# Patient Record
Sex: Male | Born: 1955
Health system: Southern US, Community
[De-identification: ages and names within clinical notes are randomized; demographics above are authoritative.]

## PROBLEM LIST (undated history)

## (undated) DIAGNOSIS — I10 Essential (primary) hypertension: Secondary | ICD-10-CM

## (undated) DIAGNOSIS — E663 Overweight: Secondary | ICD-10-CM

## (undated) DIAGNOSIS — N189 Chronic kidney disease, unspecified: Secondary | ICD-10-CM

## (undated) DIAGNOSIS — I429 Cardiomyopathy, unspecified: Secondary | ICD-10-CM

## (undated) DIAGNOSIS — N183 Chronic kidney disease, stage 3 (moderate): Secondary | ICD-10-CM

## (undated) DIAGNOSIS — I5043 Acute on chronic combined systolic (congestive) and diastolic (congestive) heart failure: Secondary | ICD-10-CM

## (undated) DIAGNOSIS — R0602 Shortness of breath: Secondary | ICD-10-CM

## (undated) DIAGNOSIS — N529 Male erectile dysfunction, unspecified: Secondary | ICD-10-CM

## (undated) DIAGNOSIS — M10379 Gout due to renal impairment, unspecified ankle and foot: Secondary | ICD-10-CM

## (undated) DIAGNOSIS — N179 Acute kidney failure, unspecified: Secondary | ICD-10-CM

## (undated) DIAGNOSIS — I5022 Chronic systolic (congestive) heart failure: Secondary | ICD-10-CM

## (undated) DIAGNOSIS — E785 Hyperlipidemia, unspecified: Secondary | ICD-10-CM

## (undated) HISTORY — DX: Cardiomyopathy, unspecified: I42.9

## (undated) HISTORY — PX: NO PAST SURGERIES: SHX2092

## (undated) HISTORY — DX: Essential (primary) hypertension: I10

## (undated) HISTORY — DX: Chronic systolic (congestive) heart failure: I50.22

## (undated) HISTORY — DX: Gout due to renal impairment, unspecified ankle and foot: M10.379

## (undated) HISTORY — DX: Hyperlipidemia, unspecified: E78.5

## (undated) HISTORY — DX: Shortness of breath: R06.02

## (undated) HISTORY — DX: Acute kidney failure, unspecified: N17.9

## (undated) HISTORY — DX: Chronic kidney disease, unspecified: N18.9

## (undated) HISTORY — DX: Male erectile dysfunction, unspecified: N52.9

## (undated) HISTORY — DX: Overweight: E66.3

## (undated) HISTORY — DX: Acute on chronic combined systolic (congestive) and diastolic (congestive) heart failure: I50.43

## (undated) HISTORY — DX: Chronic kidney disease, stage 3 (moderate): N18.3

---

## 2002-09-03 ENCOUNTER — Emergency Department (HOSPITAL_COMMUNITY): Admission: AD | Admit: 2002-09-03 | Discharge: 2002-09-04 | Payer: Self-pay | Admitting: Emergency Medicine

## 2002-12-15 ENCOUNTER — Inpatient Hospital Stay (HOSPITAL_COMMUNITY): Admission: EM | Admit: 2002-12-15 | Discharge: 2002-12-21 | Payer: Self-pay | Admitting: Emergency Medicine

## 2002-12-16 ENCOUNTER — Encounter (INDEPENDENT_AMBULATORY_CARE_PROVIDER_SITE_OTHER): Payer: Self-pay | Admitting: *Deleted

## 2002-12-27 ENCOUNTER — Encounter: Admission: RE | Admit: 2002-12-27 | Discharge: 2002-12-27 | Payer: Self-pay | Admitting: Internal Medicine

## 2004-09-23 IMAGING — NM NM MYOCAR MULTI W/ SPECT
7 series · 42 of 42 positions shown · non-contrast
Comparison: none

CLINICAL DATA: Hypertension, CHF and shortness of breath.  
 NUCLEAR MEDICINE MYOCARDIAL PERFUSION EJECTION FRACTION CALCULATION, WALL MOTION STUDY, AND MULTIPLE SPECT EXAMINATION WITH REST AND PERSANTINE STRESS   - 12/20/02
 No prior studies for comparison. 
 RADIOPHARMACEUTICALS:  10 mCi 00mtc Sestamibi IV at rest and 30 mCi 00mtc Sestamibi IV with Persantine stress.  
 EJECTION FRACTION CALCULATION:  Utilizing gated data, the end-diastolic volume is estimated to be 297 cc and the end-systolic volume 203 cc.  The calculated ejection fraction is 32 percent. 
 QGSLVEF of 32 percent. 
 WALL MOTION ANALYSIS
 The gated study shows diffuse left ventricular dysfunction.  Wall motion abnormalities are most significant at the level of the apex and inferior wall although all walls are felt to be hypokinetic.  
 IMPRESSION
 Global left ventricular dysfunction with no significant wall motion abnormalities involving inferior wall and apex. 
 MULTIPLE SPECT IMAGING WITH REST AN D PERSANTINE STRESS
 There is attenuation of the inferior wall on the study which is slightly more prominent on the rest examination.   It is felt that this is at least partially secondary to diaphragmatic attenuation although a component of scar especially towards the base of the inferior wall cannot be excluded.   Left ventricular cavity is dilated.   No evidence to suggest inducible ischemia after administration of Persantine.   
 Inferior wall attenuation felt at least partially due to diaphragmatic attenuation.   Component of scar cannot be excluded, especially involving the base of the inferior wall.   No evidence of ischemia.

[Series 1: rc rest cardiolite · 6.8mm · 6.85mm/px · 6 of 21 frames shown (1 of 7)]
[frame 2/21]
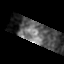
[frame 6/21]
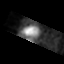
[frame 9/21]
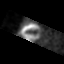
[frame 13/21]
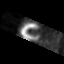
[frame 16/21]
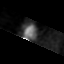
[frame 20/21]
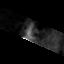

[Series 1: rc rest cardiolite · 6.8mm · 6.85mm/px · 6 of 21 frames shown (2 of 7)]
[frame 2/21]
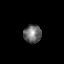
[frame 6/21]
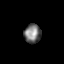
[frame 9/21]
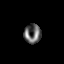
[frame 13/21]
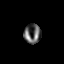
[frame 16/21]
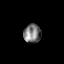
[frame 20/21]
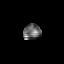

[Series 1: rc rest cardiolite · 6.8mm · 6.85mm/px · 6 of 27 frames shown (3 of 7)]
[frame 3/27]
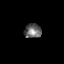
[frame 7/27]
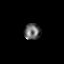
[frame 12/27]
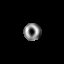
[frame 16/27]
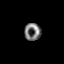
[frame 21/27]
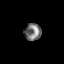
[frame 25/27]
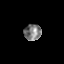

[Series 1: rc rest cardiolite · 6.85mm/px · 6 of 64 frames shown (4 of 7)]
[frame 6/64]
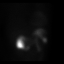
[frame 16/64]
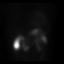
[frame 27/64]
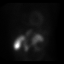
[frame 38/64]
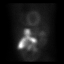
[frame 48/64]
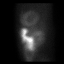
[frame 59/64]
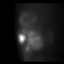

[Series 1: rc rest cardiolite · 6.8mm · 6.85mm/px · 6 of 21 frames shown (5 of 7)]
[frame 2/21]
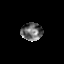
[frame 6/21]
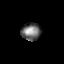
[frame 9/21]
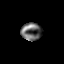
[frame 13/21]
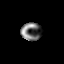
[frame 16/21]
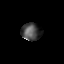
[frame 20/21]
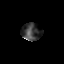

[Series 1: rc rest cardiolite · 6.8mm · 6.85mm/px · 6 of 21 frames shown (6 of 7)]
[frame 2/21]
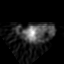
[frame 6/21]
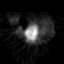
[frame 9/21]
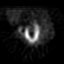
[frame 13/21]
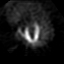
[frame 16/21]
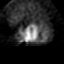
[frame 20/21]
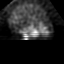

[Series 1: rc rest cardiolite · 6.8mm · 6.85mm/px · 6 of 27 frames shown (7 of 7)]
[frame 3/27]
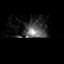
[frame 7/27]
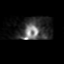
[frame 12/27]
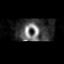
[frame 16/27]
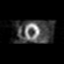
[frame 21/27]
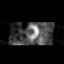
[frame 25/27]
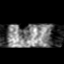

[42 of 42 positions shown; findings below may reference images not displayed]

## 2010-01-18 ENCOUNTER — Inpatient Hospital Stay (HOSPITAL_COMMUNITY)
Admission: EM | Admit: 2010-01-18 | Discharge: 2010-01-21 | Payer: Self-pay | Source: Home / Self Care | Attending: Internal Medicine | Admitting: Internal Medicine

## 2010-01-19 ENCOUNTER — Encounter (INDEPENDENT_AMBULATORY_CARE_PROVIDER_SITE_OTHER): Payer: Self-pay | Admitting: Internal Medicine

## 2010-02-01 ENCOUNTER — Emergency Department (HOSPITAL_COMMUNITY)
Admission: EM | Admit: 2010-02-01 | Discharge: 2010-02-01 | Payer: Self-pay | Source: Home / Self Care | Admitting: Family Medicine

## 2010-02-17 ENCOUNTER — Encounter: Payer: Self-pay | Admitting: Internal Medicine

## 2010-02-23 NOTE — H&P (Signed)
NAME:  Jeffery Hodges, Jeffery Hodges NO.:  [id number]  MEDICAL RECORD NO.:  [id number]          PATIENT TYPE:  INP  LOCATION:  1234                         FACILITY:  [facility]  PHYSICIAN:  Massie Maroon, MD        DATE OF BIRTH:  [birth year]  DATE OF ADMISSION:  01/18/2010 DATE OF DISCHARGE:                             HISTORY & PHYSICAL   CHIEF COMPLAINT:  "I was short of breath."  HISTORY OF PRESENT ILLNESS:  This 55 year old male with a history of cardiomyopathy and hypertension apparently presents with complaints of shortness of breath.  Apparently, it all started several weeks ago when he had a cold spell and he felt congested and then began having mucus and then began having increasing shortness of breath and wheezing.  This was worsening and so the patient presented today for evaluation and was found to be hypertensive in the ED to 187/142 initially.  Chest x-ray showed the possibility of mild CHF and BNP was elevated at 584.  Cardiac markers were negative and D-dimer was positive, but V/Q scan showed low probability V/Q.  The patient denies any chest pain, palpitations, weight gain, orthopnea, or PND.  The patient will be admitted for workup of possible mild CHF as well as hypertensive emergency.  PAST MEDICAL HISTORY: 1. Hypertension. 2. Cardiomyopathy (ejection fraction 15% to 25% on December 16, 2002,     cardiac 2-D echo) and mild aortic valvular regurgitation,     concentric LVH, estimated peak pulmonary artery systolic blood     pressure of 50.  PAST SURGICAL HISTORY:  None.  SOCIAL HISTORY:  The patient was born in [place].  He grew up in [place].  He does not smoke or drink at the present time.  He quit smoking in 2004 and smoked 1 pack per day for 28 years.  He presently works at [employer] at [employer].  FAMILY HISTORY:  Mother died at age 26 of lupus.  Father died of prostate cancer at age 24 and was a smoker.  ALLERGIES:  No known  drug allergies.  MEDICATIONS:  None.  REVIEW OF SYSTEMS:  Negative for all 10-organ systems except for pertinent positives stated above.  PHYSICAL EXAMINATION:  VITAL SIGNS:  Temperature 97.6, pulse 102, blood pressure 187/142, pulse ox is 95% on room air. HEENT:  Anicteric. NECK:  No JVD. HEART:  Regular rate and rhythm.  S1, S2.  No murmurs, gallops or rubs. LUNGS:  Clear to auscultation bilaterally. ABDOMEN:  Soft, nontender, nondistended.  Positive bowel sounds. EXTREMITIES:  No cyanosis, clubbing or edema. SKIN:  No rashes. LYMPH NODES:  No adenopathy. NEUROLOGIC:  Exam nonfocal.  LABORATORY DATA:  D-dimer 2.46 (positive).  WBC 5.7, hemoglobin 14.9, platelet count is 242.  Troponin-I less than 0.05.  Sodium 138, potassium 3.6, BUN 32, creatinine 2.30, AST 23, ALT 30, BNP 584.  X-RAY:  V/Q scan, low probability.  Chest x-ray:  Marked cardiomegaly with possible mild interstitial edema.  Nuclear stress test, December 20, 2002, negative for inducible ischemia.  EKG shows normal sinus rhythm at 100, normal axis, poor R-wave  progression, T-wave inversion in V2 through V6.  ASSESSMENT AND PLAN: 1. Congestive heart failure (mild):  Strict inputs and outputs.  Check     daily weights.  Lasix 40 mg IV b.i.d.  Consider spironolactone,     afterload reduction with lisinopril, nitroglycerine paste and     hydralazine for additional afterload reduction. 2. Hypertensive emergency:  Hydralazine IV q.6 h. p.r.n. systolic     blood pressure greater than 160, start on nitroglycerine paste, one-     half inch topically q.8 h.  Continue lisinopril at 5 mg p.o. daily     for now. 3. Chronic kidney disease (stage 3):  The patient will continue on his     regular job, we will consider stopping lisinopril and letting his     kidneys resolve. 4. Chronic kidney disease:  Check renal ultrasound. 5. Deep venous thrombosis prophylaxis:  Sequential compression     devices.     Massie Maroon,  MD     JYK/MEDQ  D:  01/19/2010  T:  01/19/2010  Job:  [id number]  Electronically Signed by Pearson Grippe MD on 02/23/2010 08:30:01 PM

## 2010-04-09 LAB — CBC
HCT: 39.8 % (ref 39.0–52.0)
HCT: 42.5 % (ref 39.0–52.0)
HCT: 43 % (ref 39.0–52.0)
HCT: 44.4 % (ref 39.0–52.0)
Hemoglobin: 14.1 g/dL (ref 13.0–17.0)
Hemoglobin: 14.9 g/dL (ref 13.0–17.0)
Hemoglobin: 14.9 g/dL (ref 13.0–17.0)
Hemoglobin: 15.3 g/dL (ref 13.0–17.0)
MCH: 31.6 pg (ref 26.0–34.0)
MCH: 32 pg (ref 26.0–34.0)
MCH: 32 pg (ref 26.0–34.0)
MCHC: 34.7 g/dL (ref 30.0–36.0)
MCHC: 35.1 g/dL (ref 30.0–36.0)
MCHC: 35.4 g/dL (ref 30.0–36.0)
MCV: 90.5 fL (ref 78.0–100.0)
MCV: 91.2 fL (ref 78.0–100.0)
MCV: 91.3 fL (ref 78.0–100.0)
Platelets: 206 10*3/uL (ref 150–400)
Platelets: 230 10*3/uL (ref 150–400)
Platelets: 242 10*3/uL (ref 150–400)
RBC: 4.4 MIL/uL (ref 4.22–5.81)
RBC: 4.66 MIL/uL (ref 4.22–5.81)
RBC: 4.71 MIL/uL (ref 4.22–5.81)
RBC: 4.83 MIL/uL (ref 4.22–5.81)
RDW: 13.3 % (ref 11.5–15.5)
RDW: 13.4 % (ref 11.5–15.5)
RDW: 13.4 % (ref 11.5–15.5)
WBC: 4.5 10*3/uL (ref 4.0–10.5)
WBC: 5.7 10*3/uL (ref 4.0–10.5)
WBC: 6 10*3/uL (ref 4.0–10.5)

## 2010-04-09 LAB — BRAIN NATRIURETIC PEPTIDE
Pro B Natriuretic peptide (BNP): 579 pg/mL — ABNORMAL HIGH (ref 0.0–100.0)
Pro B Natriuretic peptide (BNP): 584 pg/mL — ABNORMAL HIGH (ref 0.0–100.0)

## 2010-04-09 LAB — COMPREHENSIVE METABOLIC PANEL
ALT: 27 U/L (ref 0–53)
ALT: 30 U/L (ref 0–53)
AST: 19 U/L (ref 0–37)
AST: 23 U/L (ref 0–37)
Albumin: 2.8 g/dL — ABNORMAL LOW (ref 3.5–5.2)
Albumin: 2.9 g/dL — ABNORMAL LOW (ref 3.5–5.2)
Alkaline Phosphatase: 45 U/L (ref 39–117)
Alkaline Phosphatase: 56 U/L (ref 39–117)
BUN: 28 mg/dL — ABNORMAL HIGH (ref 6–23)
BUN: 32 mg/dL — ABNORMAL HIGH (ref 6–23)
CO2: 26 mEq/L (ref 19–32)
CO2: 27 mEq/L (ref 19–32)
Calcium: 8.3 mg/dL — ABNORMAL LOW (ref 8.4–10.5)
Calcium: 9 mg/dL (ref 8.4–10.5)
Chloride: 105 mEq/L (ref 96–112)
Chloride: 106 mEq/L (ref 96–112)
Creatinine, Ser: 1.79 mg/dL — ABNORMAL HIGH (ref 0.4–1.5)
Creatinine, Ser: 2.3 mg/dL — ABNORMAL HIGH (ref 0.4–1.5)
GFR calc Af Amer: 36 mL/min — ABNORMAL LOW (ref >60)
GFR calc Af Amer: 48 mL/min — ABNORMAL LOW (ref >60)
GFR calc non Af Amer: 30 mL/min — ABNORMAL LOW (ref >60)
GFR calc non Af Amer: 40 mL/min — ABNORMAL LOW (ref >60)
Glucose, Bld: 102 mg/dL — ABNORMAL HIGH (ref 70–99)
Glucose, Bld: 124 mg/dL — ABNORMAL HIGH (ref 70–99)
Potassium: 3.2 mEq/L — ABNORMAL LOW (ref 3.5–5.1)
Potassium: 3.6 mEq/L (ref 3.5–5.1)
Sodium: 138 mEq/L (ref 135–145)
Sodium: 140 mEq/L (ref 135–145)
Total Bilirubin: 0.8 mg/dL (ref 0.3–1.2)
Total Bilirubin: 0.9 mg/dL (ref 0.3–1.2)
Total Protein: 5.4 g/dL — ABNORMAL LOW (ref 6.0–8.3)
Total Protein: 5.9 g/dL — ABNORMAL LOW (ref 6.0–8.3)

## 2010-04-09 LAB — BASIC METABOLIC PANEL
BUN: 24 mg/dL — ABNORMAL HIGH (ref 6–23)
CO2: 30 mEq/L (ref 19–32)
Calcium: 8.2 mg/dL — ABNORMAL LOW (ref 8.4–10.5)
Calcium: 8.8 mg/dL (ref 8.4–10.5)
Chloride: 102 mEq/L (ref 96–112)
Creatinine, Ser: 1.88 mg/dL — ABNORMAL HIGH (ref 0.4–1.5)
GFR calc Af Amer: 40 mL/min — ABNORMAL LOW (ref >60)
GFR calc Af Amer: 45 mL/min — ABNORMAL LOW (ref >60)
GFR calc non Af Amer: 33 mL/min — ABNORMAL LOW (ref >60)
GFR calc non Af Amer: 37 mL/min — ABNORMAL LOW (ref >60)
Glucose, Bld: 102 mg/dL — ABNORMAL HIGH (ref 70–99)
Potassium: 3.2 mEq/L — ABNORMAL LOW (ref 3.5–5.1)
Potassium: 3.7 mEq/L (ref 3.5–5.1)
Sodium: 138 mEq/L (ref 135–145)
Sodium: 141 mEq/L (ref 135–145)

## 2010-04-09 LAB — DIFFERENTIAL
Basophils Absolute: 0.1 10*3/uL (ref 0.0–0.1)
Basophils Relative: 1 % (ref 0–1)
Eosinophils Absolute: 0.1 10*3/uL (ref 0.0–0.7)
Eosinophils Relative: 2 % (ref 0–5)
Lymphocytes Relative: 36 % (ref 12–46)
Lymphs Abs: 2.1 10*3/uL (ref 0.7–4.0)
Monocytes Absolute: 0.7 10*3/uL (ref 0.1–1.0)
Monocytes Relative: 12 % (ref 3–12)
Neutro Abs: 2.8 10*3/uL (ref 1.7–7.7)
Neutrophils Relative %: 49 % (ref 43–77)

## 2010-04-09 LAB — CARDIAC PANEL(CRET KIN+CKTOT+MB+TROPI)
CK, MB: 2.5 ng/mL (ref 0.3–4.0)
CK, MB: 2.9 ng/mL (ref 0.3–4.0)
Relative Index: 1.2 (ref 0.0–2.5)
Relative Index: 1.2 (ref 0.0–2.5)
Total CK: 217 U/L (ref 7–232)
Total CK: 248 U/L — ABNORMAL HIGH (ref 7–232)
Troponin I: 0.06 ng/mL (ref 0.00–0.06)
Troponin I: 0.08 ng/mL — ABNORMAL HIGH (ref 0.00–0.06)

## 2010-04-09 LAB — POCT CARDIAC MARKERS
CKMB, poc: 1 ng/mL (ref 1.0–8.0)
Myoglobin, poc: 93 ng/mL (ref 12–200)
Troponin i, poc: <0.05 ng/mL (ref 0.00–0.09)

## 2010-04-09 LAB — IRON AND TIBC
Iron: 51 ug/dL (ref 42–135)
Saturation Ratios: 19 % — ABNORMAL LOW (ref 20–55)
TIBC: 264 ug/dL (ref 215–435)
UIBC: 213 ug/dL

## 2010-04-09 LAB — D-DIMER, QUANTITATIVE: D-Dimer, Quant: 2.46 ug/mL-FEU — ABNORMAL HIGH (ref 0.00–0.48)

## 2010-04-09 LAB — LIPID PANEL
Cholesterol: 172 mg/dL (ref 0–200)
LDL Cholesterol: 118 mg/dL — ABNORMAL HIGH (ref 0–99)
Total CHOL/HDL Ratio: 4.8 RATIO
VLDL: 18 mg/dL (ref 0–40)

## 2010-04-09 LAB — MRSA PCR SCREENING: MRSA by PCR: NEGATIVE

## 2010-04-09 LAB — HEMOGLOBIN A1C: Hgb A1c MFr Bld: 5.7 % — ABNORMAL HIGH (ref <5.7)

## 2010-04-09 LAB — FERRITIN: Ferritin: 96 ng/mL (ref 22–322)

## 2010-04-09 LAB — TSH: TSH: 2.485 u[IU]/mL (ref 0.350–4.500)

## 2010-06-15 NOTE — Discharge Summary (Signed)
NAME:  CICERO, NOY NO.:  [id number]   MEDICAL RECORD NO.:  [id number]                   PATIENT TYPE:  INP   LOCATION:  3739                                 FACILITY:  [facility]   PHYSICIAN:  Alvester Morin, M.D.               DATE OF BIRTH:  1955-08-01   DATE OF ADMISSION:  12/15/2002  DATE OF DISCHARGE:  12/21/2002                                 DISCHARGE SUMMARY   CONTINUITY DOCTOR:  Donald Pore, M.D. in the outpatient clinic.   DISCHARGE DIAGNOSES:  1. Hypertensive crisis.  The patient reported to the emergency department     with a history of six months of noncompliance with medication, shortness     of breath x1.5 weeks precipitated by an upper respiratory infection.  On     admission blood pressure was 165/120.  The patient was started on     hydralazine on admission and metoprolol.  He was also diuresed with     Lasix.  Antihypertensive medications were titrated during his admission     and the patient is being discharged on the following medications:  Lasix     20 mg p.o. daily, Coreg 25 mg b.i.d., Altace 2.5 mg daily, Norvasc 10 mg     p.o. daily.  2. Cardiomyopathy.  During admission patient had a 2-D echocardiogram which     showed severely reduced left ventricular systolic function and an     ejection fraction estimated between 15-25%.  There was severe diffuse     left ventricular hypokinesis.  He had mild aortic valvular regurgitation,     mild mitral valvular regurgitation, left atrium which was mildly to     moderately dilated, and a right ventricle which was moderately dilated.     Right ventricular systolic function was moderately reduced and estimated     peak pulmonary artery systolic pressure was 50 mmHg.  The right atrium     was mildly to moderately dilated.  Prior study was on December 16, 2002.     On December 20, 2002 the patient had a Persantine Cardiolite study which     showed an ejection fraction of 32%, showed global  left ventricular     dysfunction with no significant wall motion abnormalities involving     inferior wall and apex and also showed inferior wall attenuation felt to     be partially due to diaphragmatic attenuation __________ could not be     excluded involving the base of the inferior wall, no evidence of     ischemia.  Signs and symptoms of CHF were clinically resolved during his     hospital stay.  The patient is currently on Coreg 25 b.i.d., Lanoxin     0.125 mg p.o. q.48h., Lasix 20 mg p.o. daily, and Altace 2.5 mg p.o.     daily for his congestive heart failure.  3. Elevated  cardiac enzymes, abnormal EKG.  The patient on admission did     have mild elevated troponin I of 0.07.  However, he also had a BNP of 883     and a serum BUN and creatinine of 25 and 1.9, respectively.  Persantine     Cardiolite was performed which was as previously discussed.  There is no     elevation in patient's CK-MB or total CK.  It is felt that the mild     elevation in troponin I was likely due to his congestive heart failure on     presentation.  4. Renal insufficiency.  The patient presented with an elevated BUN at 25     and creatinine at 1.9.  On discharge, his BUN 29 and creatinine is 2,     respectively, __________.  The patient is very muscular and is likely at     his baseline, however, we will schedule follow-up with a nephrologist.     We will also schedule an MRA of the patient's renal arteries on an     outpatient basis.  5. Hyperlipidemia.  The patient has had a total serum cholesterol of 162,     triglycerides 68, HDL cholesterol 56, total cholesterol/HDL ratio 2.9,     LDL cholesterol 92, VLDL 14.  The patient was started on Zocor during his     stay and will be discharged on Zocor 20 mg p.o. daily.   DISCHARGE MEDICATIONS:  1. Norvasc 10 mg p.o. daily.  2. Ecotrin 325 mg p.o. daily.  3. Coreg 25 mg p.o. b.i.d.  4. Lanoxin 0.125 mg p.o. q.48h.  5. Lasix 20 mg p.o. daily.  6. Altace  2.5 mg p.o. daily.  7. Zocor 20 mg p.o. daily.   CONSULTS:  Cardiology consult was obtained with Vonna Kotyk R. Jacinto Halim, M.D.   DISCHARGE LABORATORIES:  CBC within normal limits.  Chemistry:  BUN 29,  creatinine 2.0 on discharge.   ADMITTING HISTORY AND PHYSICAL:  Please see dictated H&P on chart.   HOSPITAL COURSE:  Please see previously listed problem list.   HOSPITAL FOLLOWUP:  The patient has a scheduled outpatient follow-up  appointment with Donald Pore, MD in the [facility] on  Monday, December 27, 2002 at 3 p.m.      Donald Pore, MD                          Alvester Morin, M.D.    HP/MEDQ  D:  12/21/2002  T:  12/22/2002  Job:  [id number]

## 2010-10-09 ENCOUNTER — Encounter: Payer: Self-pay | Admitting: Cardiovascular Disease

## 2010-10-09 ENCOUNTER — Encounter: Payer: Self-pay | Admitting: *Deleted

## 2010-10-10 ENCOUNTER — Ambulatory Visit (INDEPENDENT_AMBULATORY_CARE_PROVIDER_SITE_OTHER): Payer: Self-pay | Admitting: Cardiovascular Disease

## 2010-10-10 ENCOUNTER — Encounter: Payer: Self-pay | Admitting: Cardiovascular Disease

## 2010-10-10 VITALS — BP 162/106 | HR 71 | Resp 18 | Ht 70.0 in | Wt 188.0 lb

## 2010-10-10 DIAGNOSIS — I10 Essential (primary) hypertension: Secondary | ICD-10-CM | POA: Insufficient documentation

## 2010-10-10 DIAGNOSIS — N189 Chronic kidney disease, unspecified: Secondary | ICD-10-CM | POA: Insufficient documentation

## 2010-10-10 HISTORY — DX: Chronic kidney disease, unspecified: N18.9

## 2010-10-10 MED ORDER — LISINOPRIL 5 MG PO TABS: 5.0000 mg | ORAL_TABLET | Freq: Every day | ORAL | Status: DC

## 2010-10-10 MED ORDER — LISINOPRIL 20 MG PO TABS: 20.0000 mg | ORAL_TABLET | Freq: Every day | ORAL | Status: DC

## 2010-10-10 NOTE — Patient Instructions (Addendum)
Your physician recommends that you schedule a follow-up appointment in: 6 MONTHS WITH DR [facility]  Your physician recommends that you continue on your current medications as directed. Please refer to the Current Medication list given to you today.  Your physician has requested that you have an echocardiogram. Echocardiography is a painless test that uses sound waves to create images of your heart. It provides your doctor with information about the size and shape of your heart and how well your heart's chambers and valves are working. This procedure takes approximately one hour. There are no restrictions for this procedure. DX 401.1

## 2010-10-10 NOTE — Progress Notes (Signed)
55 up referred by [facility].  History of HTN with med noncompliance and stage 3 CRF.  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   Echo 01/26/10 Still appears to be some compliance issues with meds.  Lisinopril dose should be increased.  Risk of CHF and CRF worsening  Needs reassement of EF but not ETT.  Gave samples of Benicar.  Functional class 1 Working at [employer]      - Left ventricle: Global hypokinesis. The cavity size was mildly     dilated. Wall thickness was increased in a pattern of mild LVH.     The estimated ejection fraction was 30%. Doppler parameters are     consistent with high ventricular filling pressure.   - Mitral valve: Mild regurgitation.   - Left atrium: The atrium was moderately dilated.   - Right ventricle: The cavity size was mildly dilated.   - Pulmonary arteries: PA peak pressure: 50mm Hg (S).   - Pericardium, extracardiac: There is a moderate circumferential     pericardial effusion. There is no evidence of tamponade.  ROS: Denies fever, malais, weight loss, blurry vision, decreased visual acuity, cough, sputum, SOB, hemoptysis, pleuritic pain, palpitaitons, heartburn, abdominal pain, melena, lower extremity edema, claudication, or rash.  All other systems reviewed and negative   General: Affect appropriate Healthy:  appears stated age HEENT: normal Neck supple with no adenopathy JVP normal no bruits no thyromegaly Lungs clear with no wheezing and good diaphragmatic motion Heart:  S1/S2 no murmur,rub, gallop or click PMI normal Abdomen: benighn, BS positve, no tenderness, no AAA no bruit.  No HSM or HJR Distal pulses intact with no bruits No edema Neuro non-focal Skin warm and dry No muscular weakness  Medications Current Outpatient Prescriptions  Medication Sig Dispense Refill  . aspirin 81 MG tablet Take 81 mg by mouth daily.        . carvedilol (COREG) 12.5 MG tablet Take 12.5 mg by mouth 2 (two) times  daily with a meal.        . furosemide (LASIX) 40 MG tablet Take 40 mg by mouth daily.        . isosorbide mononitrate (IMDUR) 30 MG 24 hr tablet Take 30 mg by mouth daily.        Marland Kitchen lisinopril (PRINIVIL,ZESTRIL) 5 MG tablet Take 5 mg by mouth daily.        Marland Kitchen POTASSIUM CHLORIDE PO Take by mouth. 20 meq  1 tab daily      . PRAVASTATIN SODIUM PO Take by mouth. 20 mg daily        Allergies Review of patient's allergies indicates no known allergies.  Family History: No family history on file.  Social History: History   Social History  . Marital Status: Single    Spouse Name: N/A    Number of Children: N/A  . Years of Education: N/A   Occupational History  . Not on file.   Social History Main Topics  . Smoking status: Current Some Day Smoker  . Smokeless tobacco: Not on file  . Alcohol Use: Not on file  . Drug Use: Not on file  . Sexually Active: Not on file   Other Topics Concern  . Not on file   Social History Narrative  . No narrative on file    Electrocardiogram:  NSR 69 PR 200 LAD ? Old IMI    Assessment and Plan

## 2010-10-10 NOTE — Assessment & Plan Note (Signed)
Increase lisinopril  Echo to reassess EF  Low salt diet  Compliance with meds

## 2010-10-10 NOTE — Progress Notes (Signed)
Addended by: Burnett Kanaris A on: 10/10/2010 02:06 PM   Modules accepted: Orders

## 2010-10-10 NOTE — Assessment & Plan Note (Signed)
Increase lisinopril  Compliance an issue.

## 2010-10-10 NOTE — Assessment & Plan Note (Signed)
F/U [facility]  Check Cr q 6 months.  Low protein diet  Avoid NSAI's

## 2010-10-24 ENCOUNTER — Other Ambulatory Visit (HOSPITAL_COMMUNITY): Payer: Self-pay | Admitting: Radiology

## 2010-11-02 ENCOUNTER — Ambulatory Visit (HOSPITAL_COMMUNITY): Payer: Self-pay | Attending: Cardiovascular Disease | Admitting: Radiology

## 2010-11-02 DIAGNOSIS — N189 Chronic kidney disease, unspecified: Secondary | ICD-10-CM | POA: Insufficient documentation

## 2010-11-02 DIAGNOSIS — I509 Heart failure, unspecified: Secondary | ICD-10-CM | POA: Insufficient documentation

## 2010-11-02 DIAGNOSIS — F17200 Nicotine dependence, unspecified, uncomplicated: Secondary | ICD-10-CM | POA: Insufficient documentation

## 2010-11-02 DIAGNOSIS — I428 Other cardiomyopathies: Secondary | ICD-10-CM | POA: Insufficient documentation

## 2010-11-02 DIAGNOSIS — I129 Hypertensive chronic kidney disease with stage 1 through stage 4 chronic kidney disease, or unspecified chronic kidney disease: Secondary | ICD-10-CM | POA: Insufficient documentation

## 2010-11-02 DIAGNOSIS — I10 Essential (primary) hypertension: Secondary | ICD-10-CM

## 2010-11-05 ENCOUNTER — Telehealth: Payer: Self-pay | Admitting: Cardiovascular Disease

## 2010-11-05 NOTE — Telephone Encounter (Signed)
Walk In Pt Form " Pr needs Samples Of Lisinopril" Sent to [facility]  11/05/10/km

## 2011-02-21 ENCOUNTER — Ambulatory Visit: Payer: Self-pay | Admitting: Cardiovascular Disease

## 2011-03-21 ENCOUNTER — Ambulatory Visit: Payer: Self-pay | Admitting: Cardiovascular Disease

## 2011-04-25 ENCOUNTER — Ambulatory Visit: Payer: Self-pay | Admitting: Cardiovascular Disease

## 2011-05-28 ENCOUNTER — Ambulatory Visit (INDEPENDENT_AMBULATORY_CARE_PROVIDER_SITE_OTHER): Payer: Self-pay | Admitting: Cardiovascular Disease

## 2011-05-28 ENCOUNTER — Encounter: Payer: Self-pay | Admitting: Cardiovascular Disease

## 2011-05-28 DIAGNOSIS — I10 Essential (primary) hypertension: Secondary | ICD-10-CM

## 2011-05-28 DIAGNOSIS — N289 Disorder of kidney and ureter, unspecified: Secondary | ICD-10-CM

## 2011-05-28 DIAGNOSIS — N189 Chronic kidney disease, unspecified: Secondary | ICD-10-CM

## 2011-05-28 DIAGNOSIS — I509 Heart failure, unspecified: Secondary | ICD-10-CM

## 2011-05-28 DIAGNOSIS — I428 Other cardiomyopathies: Secondary | ICD-10-CM

## 2011-05-28 LAB — BASIC METABOLIC PANEL
BUN: 35 mg/dL — ABNORMAL HIGH (ref 6–23)
CO2: 27 mEq/L (ref 19–32)
Chloride: 103 mEq/L (ref 96–112)
Glucose, Bld: 108 mg/dL — ABNORMAL HIGH (ref 70–99)
Potassium: 4.4 mEq/L (ref 3.5–5.1)
Sodium: 139 mEq/L (ref 135–145)

## 2011-05-28 NOTE — Patient Instructions (Signed)
Your physician wants you to follow-up in: YEAR WITH DR Haywood Filler will receive a reminder letter in the mail two months in advance. If you don't receive a letter, please call our office to schedule the follow-up appointment. Your physician recommends that you continue on your current medications as directed. Please refer to the Current Medication list given to you today. Your physician recommends that you return for lab work in: TODAY BMET  DX RENAL  DISEASE  Your physician has requested that you have an echocardiogram. Echocardiography is a painless test that uses sound waves to create images of your heart. It provides your doctor with information about the size and shape of your heart and how well your heart's chambers and valves are working. This procedure takes approximately one hour. There are no restrictions for this procedure. DX CARDIOMYOPATHY

## 2011-05-28 NOTE — Assessment & Plan Note (Signed)
F/U BMET today

## 2011-05-28 NOTE — Assessment & Plan Note (Signed)
F/U echo  EF had improved  Continue ACE

## 2011-05-28 NOTE — Progress Notes (Signed)
Patient ID: Jeffery Hodges, male   DOB: 09-09-1955, 56 y.o.   MRN: [id number] 55 up referred by [facility] initially in 2011  . History of HTN with med noncompliance. Has stage 3 CRF. 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  Still appears to be some compliance issues with meds. Echo 11/02/10 reviewed  EF improved to normal range 55-60% from diffuse hypoinesis and EF of 30%   ROS: Denies fever, malais, weight loss, blurry vision, decreased visual acuity, cough, sputum, SOB, hemoptysis, pleuritic pain, palpitaitons, heartburn, abdominal pain, melena, lower extremity edema, claudication, or rash.  All other systems reviewed and negative  General: Affect appropriate Healthy:  appears stated age HEENT: normal Neck supple with no adenopathy JVP normal no bruits no thyromegaly Lungs clear with no wheezing and good diaphragmatic motion Heart:  S1/S2 no murmur, no rub, gallop or click PMI normal Abdomen: benighn, BS positve, no tenderness, no AAA no bruit.  No HSM or HJR Distal pulses intact with no bruits No edema Neuro non-focal Skin warm and dry No muscular weakness   Current Outpatient Prescriptions  Medication Sig Dispense Refill  . aspirin 81 MG tablet Take 81 mg by mouth daily.        . carvedilol (COREG) 12.5 MG tablet Take 12.5 mg by mouth 2 (two) times daily with a meal.        . furosemide (LASIX) 40 MG tablet Take 40 mg by mouth daily.        . isosorbide mononitrate (IMDUR) 30 MG 24 hr tablet Take 30 mg by mouth daily.        Marland Kitchen lisinopril (PRINIVIL,ZESTRIL) 5 MG tablet Take 1 tablet (5 mg total) by mouth daily.  30 tablet  11  . POTASSIUM CHLORIDE PO Take by mouth. 20 meq  1 tab daily      . PRAVASTATIN SODIUM PO Take by mouth. 20 mg daily        Allergies  Review of patient's allergies indicates no known allergies.  Electrocardiogram:  NSR rate 80 LAD   Assessment and Plan

## 2011-05-28 NOTE — Assessment & Plan Note (Signed)
Well controlled.  Continue current medications and low sodium Dash type diet.    

## 2011-05-29 ENCOUNTER — Other Ambulatory Visit: Payer: Self-pay | Admitting: *Deleted

## 2011-05-29 DIAGNOSIS — N289 Disorder of kidney and ureter, unspecified: Secondary | ICD-10-CM

## 2011-07-08 ENCOUNTER — Ambulatory Visit (HOSPITAL_COMMUNITY): Payer: Self-pay | Attending: Cardiology | Admitting: Radiology

## 2011-07-08 DIAGNOSIS — F17200 Nicotine dependence, unspecified, uncomplicated: Secondary | ICD-10-CM | POA: Insufficient documentation

## 2011-07-08 DIAGNOSIS — I10 Essential (primary) hypertension: Secondary | ICD-10-CM | POA: Insufficient documentation

## 2011-07-08 DIAGNOSIS — I509 Heart failure, unspecified: Secondary | ICD-10-CM | POA: Insufficient documentation

## 2011-07-08 DIAGNOSIS — I517 Cardiomegaly: Secondary | ICD-10-CM | POA: Insufficient documentation

## 2011-07-08 DIAGNOSIS — I428 Other cardiomyopathies: Secondary | ICD-10-CM | POA: Insufficient documentation

## 2011-07-08 DIAGNOSIS — N289 Disorder of kidney and ureter, unspecified: Secondary | ICD-10-CM | POA: Insufficient documentation

## 2011-07-08 NOTE — Progress Notes (Signed)
Echocardiogram performed.  

## 2011-08-29 ENCOUNTER — Telehealth: Payer: Self-pay | Admitting: Cardiovascular Disease

## 2011-08-29 NOTE — Telephone Encounter (Signed)
LMTCB ./CY 

## 2011-09-11 NOTE — Telephone Encounter (Signed)
CALLED PT AGAIN THIS AM  PT ANSWERED AND THEN HUNG UP  ATTEMPTED TO CALL AGAIN  WENT STRAIGHT TO VOICE MAIL  UP TO PT TO CALL BACK RE  NOT KEEPING APPTS AT [facility]./CY

## 2012-04-07 ENCOUNTER — Telehealth: Payer: Self-pay | Admitting: Cardiovascular Disease

## 2012-04-07 NOTE — Telephone Encounter (Signed)
PT AWARE APPLICATIONS ARE AT FRONT DESK TO FILL OUT TO APPLY FOR [place] CARD ALSO INFORMED PT OF APPROPRIATE FORMS(PROOFOF INCOME) NEEDED TO SEND ALONG WITH APP   PER PT HAS ENOUGH MEDS TO LAST FOR 30 DAYS PT STATES WILL COME TO OFFICE THIS WEEK   TO FILL OUT FORMS./CY

## 2012-04-07 NOTE — Telephone Encounter (Signed)
New Problem:    Patient called in needing assistance.  The practice that the patient was receiving his medication refills through closed down 09/28/11 and he would like to discuss options for receiving his medications.  Patient would also like assistance with re-applying for an [place] card because his last card expired on 12/05/11.  Please call back.

## 2012-05-01 ENCOUNTER — Ambulatory Visit: Payer: Self-pay | Admitting: Cardiovascular Disease

## 2012-06-09 ENCOUNTER — Ambulatory Visit: Payer: Self-pay | Admitting: Cardiovascular Disease

## 2012-07-14 ENCOUNTER — Encounter: Payer: Self-pay | Admitting: Cardiovascular Disease

## 2012-09-10 ENCOUNTER — Encounter: Payer: Self-pay | Admitting: Physician Assistant

## 2012-10-01 ENCOUNTER — Ambulatory Visit: Payer: Self-pay | Admitting: Physician Assistant

## 2012-10-08 ENCOUNTER — Ambulatory Visit (INDEPENDENT_AMBULATORY_CARE_PROVIDER_SITE_OTHER): Payer: No Typology Code available for payment source | Admitting: Physician Assistant

## 2012-10-08 ENCOUNTER — Encounter: Payer: Self-pay | Admitting: Physician Assistant

## 2012-10-08 VITALS — BP 126/98 | HR 64 | Ht 68.0 in | Wt 204.0 lb

## 2012-10-08 DIAGNOSIS — I428 Other cardiomyopathies: Secondary | ICD-10-CM

## 2012-10-08 DIAGNOSIS — I5022 Chronic systolic (congestive) heart failure: Secondary | ICD-10-CM

## 2012-10-08 DIAGNOSIS — I10 Essential (primary) hypertension: Secondary | ICD-10-CM

## 2012-10-08 NOTE — Progress Notes (Signed)
[address], [address] [place], [place]  [address] Phone: [phone] Fax:  [phone]  Date:  10/08/2012   ID:  [name], DOB 1955-04-04, MRN [id number]  PCP:  Marnette Burgess, FNP ([facility] [place])  Cardiologist:  Dr. Charlton Haws     History of Present Illness: Jeffery Hodges is a 57 y.o. male who returns for follow up.   He has a hx of DCM thought to be non-ischemic, systolic CHF, HTN, CKD Stage III.  Notes indicate a Myoview in 2011 without scar.  I can only locate a Nuclear study done 11/04 that demonstrated inf defect suspicious for diaph attenuation but scar could not be ruled out, no ischemia, EF 32%.  EF has been as low as 30% on echo,m but has improved to normal.  Last echo 07/08/11:  Mod LVH, EF 55%, Gr 1 DD, mild LAE, normal RV sized and fxn.  Last seen by Dr. Charlton Haws 04/2011.  He is referred back by his PCP for follow up.  Notes indicate he was seen in the ED in [facility] in 03/2012 for acute CHF in the setting of medication non-compliance.  Otherwise, he is doing well.  The patient denies chest pain, shortness of breath, syncope, orthopnea, PND or significant pedal edema.  He is NYHA Class II.  He is being referred to nephrology for CKD.   Labs (12/11):  LDL 118, Hgb 15.3, TSH 2.485 Labs (4/13):    K 4.4, Cr 2.0  Wt Readings from Last 3 Encounters:  10/08/12 204 lb (92.534 kg)  05/28/11 211 lb (95.709 kg)  10/10/10 188 lb (85.276 kg)     Past Medical History  Diagnosis Date  . ED (erectile dysfunction)   . HTN (hypertension)   . Chronic systolic CHF (congestive heart failure)     EF 30% in the past => improved to normal;  echo 6/13:  Mod LVH, EF 55%, Gr 1 DD, mild LAE, normal RV sized and fxn  . Chronic kidney disease   . Hyperlipidemia   . Cardiomyopathy     likley HTN;  EF 30% => improved to normal (echo in 2012 and 2013 with normal LVF);  myoview 2004: inf defect likely diaph atten, no ischemia    Current Outpatient Prescriptions  Medication Sig  Dispense Refill  . aspirin 81 MG tablet Take 81 mg by mouth daily.        . carvedilol (COREG) 12.5 MG tablet Take 12.5 mg by mouth 2 (two) times daily with a meal.        . furosemide (LASIX) 40 MG tablet Take 40 mg by mouth daily.        . isosorbide mononitrate (IMDUR) 30 MG 24 hr tablet Take 30 mg by mouth daily.        Marland Kitchen lisinopril (PRINIVIL,ZESTRIL) 20 MG tablet Take 20 mg by mouth daily.      Marland Kitchen POTASSIUM CHLORIDE PO Take by mouth. 20 meq  1 tab daily      . PRAVASTATIN SODIUM PO Take by mouth. 20 mg daily       No current facility-administered medications for this visit.    Allergies:   No Known Allergies  Social History:  The patient  reports that he has quit smoking. He does not have any smokeless tobacco history on file.   ROS:  Please see the history of present illness.      All other systems reviewed and negative.   PHYSICAL EXAM: VS:  BP  126/98  Pulse 64  Ht 5\' 8"  (1.727 m)  Wt 204 lb (92.534 kg)  BMI 31.03 kg/m2  SpO2 95% Well nourished, well developed, in no acute distress HEENT: normal Neck: no JVD Cardiac:  normal S1, S2; RRR; no murmur Lungs:  clear to auscultation bilaterally, no wheezing, rhonchi or rales Abd: soft, nontender, no hepatomegaly Ext: no edema Skin: warm and dry Neuro:  CNs 2-12 intact, no focal abnormalities noted  EKG:  NSR, HR 64, low voltage, no acute changes     ASSESSMENT AND PLAN:  1. Chronic Systolic CHF:  He likely had HTN Cardiomyopathy.  EF has recovered.  I suppose he has diastolic dysfunction as well.  We discussed the importance of BP control.  Continue current dose of Coreg, Lisinopril, Imdur, Lasix.  Will request recent labs from his PCP done in 08/2012.   2. Hypertension:  Uncontrolled.  He has not taken any medications yet today.  He plans to take them when he gets home.  Continue current Rx and continue to monitor. 3. CKD:  He has nephrology referral pending.  Will obtain most recent labs as noted.  4. Disposition:  Follow  up with Dr. Charlton Haws or me in 6 mos.   Signed, Tereso Newcomer, PA-C  10/08/2012 10:24 AM

## 2012-10-08 NOTE — Patient Instructions (Addendum)
PLEASE FOLLOW UP WITH DR. Eden Emms IN 6 MONTHS

## 2013-04-29 ENCOUNTER — Ambulatory Visit (INDEPENDENT_AMBULATORY_CARE_PROVIDER_SITE_OTHER): Payer: No Typology Code available for payment source | Admitting: Family Medicine

## 2013-04-29 ENCOUNTER — Encounter: Payer: Self-pay | Admitting: Family Medicine

## 2013-04-29 VITALS — BP 118/88 | HR 64 | Temp 97.7°F | Resp 16 | Ht 67.5 in | Wt 203.0 lb

## 2013-04-29 DIAGNOSIS — I509 Heart failure, unspecified: Secondary | ICD-10-CM

## 2013-04-29 DIAGNOSIS — I429 Cardiomyopathy, unspecified: Secondary | ICD-10-CM

## 2013-04-29 DIAGNOSIS — I10 Essential (primary) hypertension: Secondary | ICD-10-CM

## 2013-04-29 DIAGNOSIS — Z0000 Encounter for general adult medical examination without abnormal findings: Secondary | ICD-10-CM

## 2013-04-29 DIAGNOSIS — N184 Chronic kidney disease, stage 4 (severe): Secondary | ICD-10-CM

## 2013-04-29 DIAGNOSIS — I428 Other cardiomyopathies: Secondary | ICD-10-CM

## 2013-04-29 DIAGNOSIS — E785 Hyperlipidemia, unspecified: Secondary | ICD-10-CM

## 2013-04-29 LAB — POCT URINALYSIS DIPSTICK
BILIRUBIN UA: NEGATIVE
GLUCOSE UA: NEGATIVE
KETONES UA: NEGATIVE
LEUKOCYTES UA: NEGATIVE
Nitrite, UA: NEGATIVE
PH UA: 5.5
Protein, UA: NEGATIVE
RBC UA: NEGATIVE
Spec Grav, UA: 1.02
Urobilinogen, UA: 0.2

## 2013-04-29 LAB — POCT CBC
Granulocyte percent: 44.7 %G (ref 37–80)
HEMATOCRIT: 44.1 % (ref 43.5–53.7)
HEMOGLOBIN: 14.1 g/dL (ref 14.1–18.1)
LYMPH, POC: 1.7 (ref 0.6–3.4)
MCH: 31.3 pg — AB (ref 27–31.2)
MCHC: 32 g/dL (ref 31.8–35.4)
MCV: 98 fL — AB (ref 80–97)
MID (cbc): 0.4 (ref 0–0.9)
MPV: 7.9 fL (ref 0–99.8)
POC Granulocyte: 1.7 — AB (ref 2–6.9)
POC LYMPH PERCENT: 43.9 %L (ref 10–50)
POC MID %: 11.4 %M (ref 0–12)
Platelet Count, POC: 205 10*3/uL (ref 142–424)
RBC: 4.5 M/uL — AB (ref 4.69–6.13)
RDW, POC: 13.2 %
WBC: 3.8 10*3/uL — AB (ref 4.6–10.2)

## 2013-04-29 LAB — POCT UA - MICROSCOPIC ONLY
BACTERIA, U MICROSCOPIC: NEGATIVE
CASTS, UR, LPF, POC: NEGATIVE
CRYSTALS, UR, HPF, POC: NEGATIVE
Epithelial cells, urine per micros: NEGATIVE
MUCUS UA: NEGATIVE
RBC, URINE, MICROSCOPIC: NEGATIVE
WBC, Ur, HPF, POC: NEGATIVE
Yeast, UA: NEGATIVE

## 2013-04-29 MED ORDER — POTASSIUM CHLORIDE CRYS ER 20 MEQ PO TBCR: 20.0000 meq | EXTENDED_RELEASE_TABLET | Freq: Every day | ORAL | Status: DC

## 2013-04-29 MED ORDER — LISINOPRIL 20 MG PO TABS: 20.0000 mg | ORAL_TABLET | Freq: Every day | ORAL | Status: DC

## 2013-04-29 MED ORDER — PRAVASTATIN SODIUM 20 MG PO TABS: ORAL_TABLET | ORAL | Status: DC

## 2013-04-29 MED ORDER — ISOSORBIDE MONONITRATE ER 30 MG PO TB24: 30.0000 mg | ORAL_TABLET | Freq: Every day | ORAL | Status: DC

## 2013-04-29 MED ORDER — CARVEDILOL 12.5 MG PO TABS: 12.5000 mg | ORAL_TABLET | Freq: Two times a day (BID) | ORAL | Status: DC

## 2013-04-29 MED ORDER — FUROSEMIDE 40 MG PO TABS: 40.0000 mg | ORAL_TABLET | Freq: Every day | ORAL | Status: DC
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
## 2013-04-29 NOTE — Patient Instructions (Addendum)
Continue current medications  You cannot take Levitra, Cialis, or Viagra because of interaction with the isosorbide like collagen blood pressure to drop dangerously low  Continue regular exercise  Advise returning to see your cardiologist some time.  I will let you know the results of your laboratory tests. If your kidney function test are not good I will send you to a kidney specialist also.

## 2013-04-30 LAB — COMPREHENSIVE METABOLIC PANEL
ALBUMIN: 3.9 g/dL (ref 3.5–5.2)
ALT: 18 U/L (ref 0–53)
AST: 19 U/L (ref 0–37)
Alkaline Phosphatase: 46 U/L (ref 39–117)
BUN: 40 mg/dL — AB (ref 6–23)
CALCIUM: 9.7 mg/dL (ref 8.4–10.5)
CHLORIDE: 104 meq/L (ref 96–112)
CO2: 29 mEq/L (ref 19–32)
CREATININE: 1.58 mg/dL — AB (ref 0.50–1.35)
GLUCOSE: 104 mg/dL — AB (ref 70–99)
POTASSIUM: 5.3 meq/L (ref 3.5–5.3)
Sodium: 138 mEq/L (ref 135–145)
Total Bilirubin: 0.6 mg/dL (ref 0.2–1.2)
Total Protein: 7.2 g/dL (ref 6.0–8.3)

## 2013-04-30 LAB — PSA: PSA: 5.7 ng/mL — AB (ref <=4.00)

## 2013-04-30 LAB — TSH: TSH: 2.134 u[IU]/mL (ref 0.350–4.500)

## 2013-04-30 LAB — LDL CHOLESTEROL, DIRECT: LDL DIRECT: 87 mg/dL

## 2013-05-08 ENCOUNTER — Other Ambulatory Visit: Payer: Self-pay | Admitting: Radiology

## 2013-05-08 DIAGNOSIS — R972 Elevated prostate specific antigen [PSA]: Secondary | ICD-10-CM

## 2013-06-22 ENCOUNTER — Telehealth: Payer: Self-pay

## 2013-06-22 NOTE — Telephone Encounter (Signed)
Pt will be in our office within the next hour, and Dr. Marlou Porch at [facility] is requesting all office notes be faxed to [phone]; labs,images,notes, ect.

## 2014-02-05 ENCOUNTER — Inpatient Hospital Stay (HOSPITAL_COMMUNITY)
Admission: EM | Admit: 2014-02-05 | Discharge: 2014-02-08 | DRG: 292 | Disposition: A | Discharge status: Home / Self Care | Payer: PRIVATE HEALTH INSURANCE | Attending: Internal Medicine | Admitting: Internal Medicine

## 2014-02-05 ENCOUNTER — Encounter (HOSPITAL_COMMUNITY): Payer: Self-pay | Admitting: Emergency Medicine

## 2014-02-05 ENCOUNTER — Emergency Department (HOSPITAL_COMMUNITY): Payer: PRIVATE HEALTH INSURANCE

## 2014-02-05 DIAGNOSIS — I129 Hypertensive chronic kidney disease with stage 1 through stage 4 chronic kidney disease, or unspecified chronic kidney disease: Secondary | ICD-10-CM | POA: Diagnosis present

## 2014-02-05 DIAGNOSIS — N189 Chronic kidney disease, unspecified: Secondary | ICD-10-CM | POA: Diagnosis present

## 2014-02-05 DIAGNOSIS — N179 Acute kidney failure, unspecified: Secondary | ICD-10-CM | POA: Diagnosis present

## 2014-02-05 DIAGNOSIS — Z79899 Other long term (current) drug therapy: Secondary | ICD-10-CM | POA: Diagnosis not present

## 2014-02-05 DIAGNOSIS — R0602 Shortness of breath: Secondary | ICD-10-CM

## 2014-02-05 DIAGNOSIS — I10 Essential (primary) hypertension: Secondary | ICD-10-CM | POA: Diagnosis present

## 2014-02-05 DIAGNOSIS — E785 Hyperlipidemia, unspecified: Secondary | ICD-10-CM | POA: Diagnosis present

## 2014-02-05 DIAGNOSIS — I5043 Acute on chronic combined systolic (congestive) and diastolic (congestive) heart failure: Secondary | ICD-10-CM

## 2014-02-05 DIAGNOSIS — Z7982 Long term (current) use of aspirin: Secondary | ICD-10-CM | POA: Diagnosis not present

## 2014-02-05 DIAGNOSIS — I5031 Acute diastolic (congestive) heart failure: Secondary | ICD-10-CM

## 2014-02-05 DIAGNOSIS — I319 Disease of pericardium, unspecified: Secondary | ICD-10-CM

## 2014-02-05 DIAGNOSIS — Z87891 Personal history of nicotine dependence: Secondary | ICD-10-CM

## 2014-02-05 HISTORY — DX: Shortness of breath: R06.02

## 2014-02-05 HISTORY — DX: Essential (primary) hypertension: I10

## 2014-02-05 HISTORY — DX: Acute on chronic combined systolic (congestive) and diastolic (congestive) heart failure: I50.43

## 2014-02-05 LAB — RAPID URINE DRUG SCREEN, HOSP PERFORMED
Amphetamines: NOT DETECTED
Barbiturates: NOT DETECTED
Benzodiazepines: NOT DETECTED
Cocaine: NOT DETECTED
OPIATES: NOT DETECTED
Tetrahydrocannabinol: NOT DETECTED

## 2014-02-05 LAB — CBC WITH DIFFERENTIAL/PLATELET
Basophils Absolute: 0 10*3/uL (ref 0.0–0.1)
Basophils Relative: 1 % (ref 0–1)
Eosinophils Absolute: 0.3 10*3/uL (ref 0.0–0.7)
Eosinophils Relative: 7 % — ABNORMAL HIGH (ref 0–5)
HCT: 38.2 % — ABNORMAL LOW (ref 39.0–52.0)
Hemoglobin: 12.4 g/dL — ABNORMAL LOW (ref 13.0–17.0)
Lymphocytes Relative: 32 % (ref 12–46)
Lymphs Abs: 1.3 10*3/uL (ref 0.7–4.0)
MCH: 30.6 pg (ref 26.0–34.0)
MCHC: 32.5 g/dL (ref 30.0–36.0)
MCV: 94.3 fL (ref 78.0–100.0)
MONOS PCT: 9 % (ref 3–12)
Monocytes Absolute: 0.4 10*3/uL (ref 0.1–1.0)
Neutro Abs: 2 10*3/uL (ref 1.7–7.7)
Neutrophils Relative %: 51 % (ref 43–77)
PLATELETS: 250 10*3/uL (ref 150–400)
RBC: 4.05 MIL/uL — AB (ref 4.22–5.81)
RDW: 14.2 % (ref 11.5–15.5)
WBC: 3.9 10*3/uL — ABNORMAL LOW (ref 4.0–10.5)

## 2014-02-05 LAB — COMPREHENSIVE METABOLIC PANEL
ALT: 16 U/L (ref 0–53)
AST: 23 U/L (ref 0–37)
Albumin: 3.1 g/dL — ABNORMAL LOW (ref 3.5–5.2)
Alkaline Phosphatase: 56 U/L (ref 39–117)
Anion gap: 10 (ref 5–15)
BUN: 21 mg/dL (ref 6–23)
CO2: 25 mmol/L (ref 19–32)
CREATININE: 1.72 mg/dL — AB (ref 0.50–1.35)
Calcium: 8.5 mg/dL (ref 8.4–10.5)
Chloride: 104 mEq/L (ref 96–112)
GFR calc non Af Amer: 42 mL/min — ABNORMAL LOW (ref >90)
GFR, EST AFRICAN AMERICAN: 48 mL/min — AB (ref >90)
Glucose, Bld: 110 mg/dL — ABNORMAL HIGH (ref 70–99)
Potassium: 4.5 mmol/L (ref 3.5–5.1)
Sodium: 139 mmol/L (ref 135–145)
Total Bilirubin: 0.8 mg/dL (ref 0.3–1.2)
Total Protein: 6.6 g/dL (ref 6.0–8.3)

## 2014-02-05 LAB — TROPONIN I: Troponin I: 0.06 ng/mL — ABNORMAL HIGH (ref <0.031)

## 2014-02-05 LAB — BRAIN NATRIURETIC PEPTIDE: B Natriuretic Peptide: 531.4 pg/mL — ABNORMAL HIGH (ref 0.0–100.0)

## 2014-02-05 MED ORDER — HYDRALAZINE HCL 20 MG/ML IJ SOLN
20.0000 mg | Freq: Once | INTRAMUSCULAR | Status: AC
  Administered 2014-02-05: 20 mg via INTRAVENOUS
  Filled 2014-02-05: qty 1

## 2014-02-05 MED ORDER — NITROGLYCERIN 2 % TD OINT
1.0000 [in_us] | TOPICAL_OINTMENT | Freq: Four times a day (QID) | TRANSDERMAL | Status: DC
  Administered 2014-02-05: 1 [in_us] via TOPICAL
  Filled 2014-02-05: qty 30

## 2014-02-05 MED ORDER — LABETALOL HCL 5 MG/ML IV SOLN
5.0000 mg | INTRAVENOUS | Status: DC | PRN
  Filled 2014-02-05: qty 4

## 2014-02-05 MED ORDER — CARVEDILOL 12.5 MG PO TABS
12.5000 mg | ORAL_TABLET | Freq: Two times a day (BID) | ORAL | Status: DC
  Administered 2014-02-05 – 2014-02-06 (×4): 12.5 mg via ORAL
  Filled 2014-02-05 (×5): qty 1

## 2014-02-05 MED ORDER — IPRATROPIUM-ALBUTEROL 0.5-2.5 (3) MG/3ML IN SOLN
3.0000 mL | RESPIRATORY_TRACT | Status: DC
  Administered 2014-02-05: 3 mL via RESPIRATORY_TRACT
  Filled 2014-02-05: qty 3

## 2014-02-05 MED ORDER — FUROSEMIDE 10 MG/ML IJ SOLN
40.0000 mg | Freq: Every day | INTRAMUSCULAR | Status: DC
  Administered 2014-02-06: 40 mg via INTRAVENOUS
  Filled 2014-02-05: qty 4

## 2014-02-05 MED ORDER — SODIUM CHLORIDE 0.9 % IV SOLN
Freq: Once | INTRAVENOUS | Status: AC
  Administered 2014-02-05: 08:00:00 via INTRAVENOUS

## 2014-02-05 MED ORDER — NITROGLYCERIN 0.4 MG SL SUBL: 0.4000 mg | SUBLINGUAL_TABLET | SUBLINGUAL | Status: DC | PRN

## 2014-02-05 MED ORDER — ACETAMINOPHEN 325 MG PO TABS
650.0000 mg | ORAL_TABLET | Freq: Four times a day (QID) | ORAL | Status: DC | PRN
  Administered 2014-02-06: 650 mg via ORAL
  Filled 2014-02-05: qty 2

## 2014-02-05 MED ORDER — ISOSORBIDE MONONITRATE ER 30 MG PO TB24
30.0000 mg | ORAL_TABLET | Freq: Every day | ORAL | Status: DC
  Administered 2014-02-05 – 2014-02-06 (×2): 30 mg via ORAL
  Filled 2014-02-05 (×2): qty 1

## 2014-02-05 MED ORDER — HEPARIN SODIUM (PORCINE) 5000 UNIT/ML IJ SOLN
5000.0000 [IU] | Freq: Three times a day (TID) | INTRAMUSCULAR | Status: DC
  Administered 2014-02-05 – 2014-02-08 (×9): 5000 [IU] via SUBCUTANEOUS
  Filled 2014-02-05 (×12): qty 1

## 2014-02-05 MED ORDER — HYDRALAZINE HCL 20 MG/ML IJ SOLN
10.0000 mg | INTRAMUSCULAR | Status: DC | PRN
  Administered 2014-02-06: 10 mg via INTRAVENOUS
  Filled 2014-02-05: qty 1

## 2014-02-05 MED ORDER — PRAVASTATIN SODIUM 20 MG PO TABS
20.0000 mg | ORAL_TABLET | Freq: Every day | ORAL | Status: DC
  Administered 2014-02-05 – 2014-02-07 (×3): 20 mg via ORAL
  Filled 2014-02-05 (×4): qty 1

## 2014-02-05 MED ORDER — ONDANSETRON HCL 4 MG/2ML IJ SOLN: 4.0000 mg | Freq: Four times a day (QID) | INTRAMUSCULAR | Status: DC | PRN

## 2014-02-05 MED ORDER — ASPIRIN 81 MG PO TABS: 81.0000 mg | ORAL_TABLET | Freq: Every day | ORAL | Status: DC

## 2014-02-05 MED ORDER — IPRATROPIUM-ALBUTEROL 0.5-2.5 (3) MG/3ML IN SOLN: 3.0000 mL | RESPIRATORY_TRACT | Status: DC | PRN

## 2014-02-05 MED ORDER — ONDANSETRON HCL 4 MG PO TABS: 4.0000 mg | ORAL_TABLET | Freq: Four times a day (QID) | ORAL | Status: DC | PRN

## 2014-02-05 MED ORDER — IPRATROPIUM-ALBUTEROL 0.5-2.5 (3) MG/3ML IN SOLN
3.0000 mL | Freq: Two times a day (BID) | RESPIRATORY_TRACT | Status: DC
Start: 2014-02-05 — End: 2014-02-06
  Administered 2014-02-05: 3 mL via RESPIRATORY_TRACT
  Filled 2014-02-05 (×2): qty 3

## 2014-02-05 MED ORDER — ACETAMINOPHEN 650 MG RE SUPP: 650.0000 mg | Freq: Four times a day (QID) | RECTAL | Status: DC | PRN

## 2014-02-05 MED ORDER — LABETALOL HCL 5 MG/ML IV SOLN
5.0000 mg | INTRAVENOUS | Status: DC | PRN
  Administered 2014-02-05: 5 mg via INTRAVENOUS
  Filled 2014-02-05: qty 4

## 2014-02-05 MED ORDER — SODIUM CHLORIDE 0.9 % IJ SOLN
3.0000 mL | Freq: Two times a day (BID) | INTRAMUSCULAR | Status: DC
  Administered 2014-02-05 – 2014-02-08 (×6): 3 mL via INTRAVENOUS

## 2014-02-05 MED ORDER — LISINOPRIL 20 MG PO TABS
20.0000 mg | ORAL_TABLET | Freq: Every day | ORAL | Status: DC
  Administered 2014-02-05 – 2014-02-06 (×2): 20 mg via ORAL
  Filled 2014-02-05 (×2): qty 1

## 2014-02-05 MED ORDER — FUROSEMIDE 10 MG/ML IJ SOLN
40.0000 mg | Freq: Once | INTRAMUSCULAR | Status: AC
Start: 2014-02-05 — End: 2014-02-05
  Administered 2014-02-05: 40 mg via INTRAVENOUS
  Filled 2014-02-05: qty 4

## 2014-02-05 MED ORDER — NITROGLYCERIN IN D5W 200-5 MCG/ML-% IV SOLN
0.0000 ug/min | Freq: Once | INTRAVENOUS | Status: AC
  Administered 2014-02-05: 5 ug/min via INTRAVENOUS
  Filled 2014-02-05: qty 250

## 2014-02-05 MED ORDER — ASPIRIN 81 MG PO CHEW
81.0000 mg | CHEWABLE_TABLET | Freq: Every day | ORAL | Status: DC
  Administered 2014-02-05 – 2014-02-08 (×4): 81 mg via ORAL
  Filled 2014-02-05 (×4): qty 1

## 2014-02-05 NOTE — ED Notes (Signed)
Bed: EB58 Expected date:  Expected time:  Means of arrival:  Comments: EMS 58yo [facility], HTN

## 2014-02-05 NOTE — H&P (Signed)
[facility] History and Physical  Oaken Berretta GOT:[id number] DOB: 06-16-1955 DOA: 02/05/2014  Referring physician: Emergency Department PCP: Margretta Ditty, MD  Specialists:   Chief Complaint: SOB  HPI: Jeffery Hodges is a 59 y.o. male  With a hx of chf, htn who presents to the ED with complaints of worsening sob. In the ED, pt noted to have markedly elevated bp with peak of 175/132. CXR was suggestive of pulm edema with concurrent hyperexpanded lungs. Pt was given one dose of lasix and hospitalist service consulted for admission.  On further questioning, pt reports not taking bp meds for "a couple months" because of "financial reasons."  Review of Systems: Per above, the remainder of the 10pt ros reviewed and are neg  Past Medical History  Diagnosis Date  . ED (erectile dysfunction)   . HTN (hypertension)   . Chronic systolic CHF (congestive heart failure)     EF 30% in the past => improved to normal;  echo 6/13:  Mod LVH, EF 55%, Gr 1 DD, mild LAE, normal RV sized and fxn  . Chronic kidney disease   . Hyperlipidemia   . Cardiomyopathy     likley HTN;  EF 30% => improved to normal (echo in 2012 and 2013 with normal LVF);  myoview 2004: inf defect likely diaph atten, no ischemia   History reviewed. No pertinent past surgical history. Social History:  reports that he has quit smoking. He does not have any smokeless tobacco history on file. His alcohol and drug histories are not on file.  where does patient live--home, ALF, SNF? and with whom if at home?  Can patient participate in ADLs?  No Known Allergies  History reviewed. No pertinent family history. Reviewed and is noncontributory to this particular case (be sure to complete)  Prior to Admission medications   Medication Sig Start Date End Date Taking? Authorizing Provider  carvedilol (COREG) 12.5 MG tablet Take 1 tablet (12.5 mg total) by mouth 2 (two) times daily with a meal. 04/29/13  Yes Peyton Najjar, MD  furosemide  (LASIX) 40 MG tablet Take 1 tablet (40 mg total) by mouth daily. 04/29/13  Yes Peyton Najjar, MD  isosorbide mononitrate (IMDUR) 30 MG 24 hr tablet Take 1 tablet (30 mg total) by mouth daily. 04/29/13  Yes Peyton Najjar, MD  lisinopril (PRINIVIL,ZESTRIL) 20 MG tablet Take 1 tablet (20 mg total) by mouth daily. 04/29/13  Yes Peyton Najjar, MD  pravastatin (PRAVACHOL) 20 MG tablet Take one daily for cholesterol 04/29/13  Yes Peyton Najjar, MD  aspirin 81 MG tablet Take 81 mg by mouth daily.      Historical Provider, MD  POTASSIUM CHLORIDE PO Take by mouth. 20 meq  1 tab daily    Historical Provider, MD  potassium chloride SA (K-DUR,KLOR-CON) 20 MEQ tablet Take 1 tablet (20 mEq total) by mouth daily. 04/29/13   Peyton Najjar, MD   Physical Exam: Daiva Eves:   02/05/14 5974 02/05/14 0745 02/05/14 0755 02/05/14 0805  BP: 161/115 161/131 159/124 159/129  Pulse: 106 106 107 109  Temp:      TempSrc:      Resp: 22 21 31 25   SpO2: 97%   96%     General:  Awake, in nad  Eyes: PERRL B  ENT: Membranes moist, dentition fair  Neck: trachea midline, neck supple  Cardiovascular: regular, s1, s2  Respiratory: normal resp effort, no wheezing  Abdomen: soft,nondistended  Skin: normal skin turgor, no abnormal  skin lesions seen  Musculoskeletal: perfused, no clubbing  Psychiatric: mood/affect normal//no auditory/visual hallucinations  Neurologic: cn2-12 grossly intact, strength/sensationintact  Labs on Admission:  Basic Metabolic Panel:  Recent Labs Lab 02/05/14 0700  NA 139  K 4.5  CL 104  CO2 25  GLUCOSE 110*  BUN 21  CREATININE 1.72*  CALCIUM 8.5   Liver Function Tests:  Recent Labs Lab 02/05/14 0700  AST 23  ALT 16  ALKPHOS 56  BILITOT 0.8  PROT 6.6  ALBUMIN 3.1*   No results for input(s): LIPASE, AMYLASE in the last 168 hours. No results for input(s): AMMONIA in the last 168 hours. CBC:  Recent Labs Lab 02/05/14 0700  WBC 3.9*  NEUTROABS 2.0  HGB 12.4*  HCT  38.2*  MCV 94.3  PLT 250   Cardiac Enzymes:  Recent Labs Lab 02/05/14 0700  TROPONINI 0.06*    BNP (last 3 results) No results for input(s): PROBNP in the last 8760 hours. CBG: No results for input(s): GLUCAP in the last 168 hours.  Radiological Exams on Admission: Dg Chest 2 View  02/05/2014   CLINICAL DATA:  One week history of difficulty breathing  EXAM: CHEST  2 VIEW  COMPARISON:  April 04, 2012  FINDINGS: There is underlying emphysematous change. There is interstitial edema with cardiomegaly and slight pulmonary venous hypertension. There is no appreciable airspace consolidation. No adenopathy. No bone lesions.  IMPRESSION: Findings consistent with congestive heart failure superimposed on emphysematous change. No airspace consolidation.   Electronically Signed   By: Bretta Bang M.D.   On: 02/05/2014 07:20    Assessment/Plan Principal Problem:   Malignant hypertension Active Problems:   Uncontrolled hypertension   Acute diastolic CHF (congestive heart failure)   SOB (shortness of breath)  1. Malignant HTN 1. Cont home meds for now 2. Will add PRN hydralazine 3. Cont to titrate bp meds as tolerated 4. Admit to med-tele 2. Suspected acute chf exacerbation 1. Evidence of volume overload on cxr 2. BNP pending 3. Will repeat 2d echo 3. SOB 1. Suspect secondary to mild pulm edema secondary to above 2. Pt does have hyperexpanded lungs in setting of prior tobacco abuse 3. Will empirically continue on scheduled nebs 4. DVT prophylaxis 1. Heparin subQ  Code Status: Full (must indicate code status--if unknown or must be presumed, indicate so) Family Communication: Pending (indicate person spoken with, if applicable, with phone number if by telephone) Disposition Plan: Admit to med-tele (indicate anticipated LOS)  Time spent:  Anmol Paschen K [facility] Pager [phone]  If 7PM-7AM, please contact night-coverage [email or web address] Password [email or web address] 02/05/2014,  8:15 AM

## 2014-02-05 NOTE — Progress Notes (Signed)
Echocardiogram 2D Echocardiogram has been performed.  Jeffery Hodges 02/05/2014, 3:44 PM

## 2014-02-05 NOTE — ED Provider Notes (Signed)
CSN: [id number]     Arrival date & time 02/05/14  0553 History   First MD Initiated Contact with Patient 02/05/14 [phone]     Chief Complaint  Patient presents with  . Shortness of Breath     (Consider location/radiation/quality/duration/timing/severity/associated sxs/prior Treatment) Patient is a 59 y.o. male presenting with shortness of breath. The history is provided by the patient.  Shortness of Breath Associated symptoms: no abdominal pain, no chest pain, no cough, no fever, no headaches, no neck pain, no rash, no sore throat and no vomiting   pt w hx htn, off all meds for the past several months, c/o sob for the past day. Gradual onset. Constant. Moderate. Denies specific exacerbating or alleviating factors. Hx chf.  Denies orthopnea or pnd. Pt denies any current or recent cp or discomfort of any sort. No associated nv, or diaphoresis. No leg swelling or pain. Denies cough or uri c/o. No fever or chills.      Past Medical History  Diagnosis Date  . ED (erectile dysfunction)   . HTN (hypertension)   . Chronic systolic CHF (congestive heart failure)     EF 30% in the past => improved to normal;  echo 6/13:  Mod LVH, EF 55%, Gr 1 DD, mild LAE, normal RV sized and fxn  . Chronic kidney disease   . Hyperlipidemia   . Cardiomyopathy     likley HTN;  EF 30% => improved to normal (echo in 2012 and 2013 with normal LVF);  myoview 2004: inf defect likely diaph atten, no ischemia   History reviewed. No pertinent past surgical history. History reviewed. No pertinent family history. History  Substance Use Topics  . Smoking status: Former Games developer  . Smokeless tobacco: Not on file     Comment: PATIENT QUITE ABOUT A YEAR NOW  . Alcohol Use: Not on file    Review of Systems  Constitutional: Negative for fever.  HENT: Negative for sore throat.   Eyes: Negative for redness.  Respiratory: Positive for shortness of breath. Negative for cough.   Cardiovascular: Negative for chest pain and leg  swelling.  Gastrointestinal: Negative for vomiting, abdominal pain and diarrhea.  Endocrine: Negative for polyuria.  Genitourinary: Negative for dysuria and flank pain.  Musculoskeletal: Negative for back pain and neck pain.  Skin: Negative for rash.  Neurological: Negative for headaches.  Hematological: Does not bruise/bleed easily.  Psychiatric/Behavioral: Negative for confusion.      Allergies  Review of patient's allergies indicates no known allergies.  Home Medications   Prior to Admission medications   Medication Sig Start Date End Date Taking? Authorizing Provider  carvedilol (COREG) 12.5 MG tablet Take 1 tablet (12.5 mg total) by mouth 2 (two) times daily with a meal. 04/29/13  Yes Peyton Najjar, MD  furosemide (LASIX) 40 MG tablet Take 1 tablet (40 mg total) by mouth daily. 04/29/13  Yes Peyton Najjar, MD  isosorbide mononitrate (IMDUR) 30 MG 24 hr tablet Take 1 tablet (30 mg total) by mouth daily. 04/29/13  Yes Peyton Najjar, MD  lisinopril (PRINIVIL,ZESTRIL) 20 MG tablet Take 1 tablet (20 mg total) by mouth daily. 04/29/13  Yes Peyton Najjar, MD  pravastatin (PRAVACHOL) 20 MG tablet Take one daily for cholesterol 04/29/13  Yes Peyton Najjar, MD  aspirin 81 MG tablet Take 81 mg by mouth daily.      Historical Provider, MD  POTASSIUM CHLORIDE PO Take by mouth. 20 meq  1 tab daily    Historical  Provider, MD  potassium chloride SA (K-DUR,KLOR-CON) 20 MEQ tablet Take 1 tablet (20 mEq total) by mouth daily. 04/29/13   Peyton Najjar, MD   BP 175/132 mmHg  Pulse 116  Temp(Src) 97.8 F (36.6 C) (Oral)  Resp 29  SpO2 95% Physical Exam  Constitutional: He is oriented to person, place, and time. He appears well-developed and well-nourished. No distress.  HENT:  Mouth/Throat: Oropharynx is clear and moist.  Eyes: Conjunctivae are normal. No scleral icterus.  Neck: Neck supple. No JVD present. No tracheal deviation present.  Cardiovascular: Normal rate, regular rhythm, normal heart  sounds and intact distal pulses.  Exam reveals no gallop and no friction rub.   No murmur heard. Pulmonary/Chest: Effort normal and breath sounds normal. No accessory muscle usage. No respiratory distress.  Abdominal: Soft. Bowel sounds are normal. He exhibits no distension. There is no tenderness.  Musculoskeletal: Normal range of motion. He exhibits no edema or tenderness.  Neurological: He is alert and oriented to person, place, and time.  Skin: Skin is warm and dry. He is not diaphoretic.  Psychiatric: He has a normal mood and affect.  Nursing note and vitals reviewed.   ED Course  Procedures (including critical care time) Labs Review  Results for orders placed or performed during the hospital encounter of 02/05/14  CBC with Differential  Result Value Ref Range   WBC 3.9 (L) 4.0 - 10.5 K/uL   RBC 4.05 (L) 4.22 - 5.81 MIL/uL   Hemoglobin 12.4 (L) 13.0 - 17.0 g/dL   HCT 16.1 (L) 09.6 - 04.5 %   MCV 94.3 78.0 - 100.0 fL   MCH 30.6 26.0 - 34.0 pg   MCHC 32.5 30.0 - 36.0 g/dL   RDW 40.9 81.1 - 91.4 %   Platelets 250 150 - 400 K/uL   Neutrophils Relative % 51 43 - 77 %   Neutro Abs 2.0 1.7 - 7.7 K/uL   Lymphocytes Relative 32 12 - 46 %   Lymphs Abs 1.3 0.7 - 4.0 K/uL   Monocytes Relative 9 3 - 12 %   Monocytes Absolute 0.4 0.1 - 1.0 K/uL   Eosinophils Relative 7 (H) 0 - 5 %   Eosinophils Absolute 0.3 0.0 - 0.7 K/uL   Basophils Relative 1 0 - 1 %   Basophils Absolute 0.0 0.0 - 0.1 K/uL  Comprehensive metabolic panel  Result Value Ref Range   Sodium 139 135 - 145 mmol/L   Potassium 4.5 3.5 - 5.1 mmol/L   Chloride 104 96 - 112 mEq/L   CO2 25 19 - 32 mmol/L   Glucose, Bld 110 (H) 70 - 99 mg/dL   BUN 21 6 - 23 mg/dL   Creatinine, Ser 7.82 (H) 0.50 - 1.35 mg/dL   Calcium 8.5 8.4 - 95.6 mg/dL   Total Protein 6.6 6.0 - 8.3 g/dL   Albumin 3.1 (L) 3.5 - 5.2 g/dL   AST 23 0 - 37 U/L   ALT 16 0 - 53 U/L   Alkaline Phosphatase 56 39 - 117 U/L   Total Bilirubin PENDING 0.3 - 1.2  mg/dL   GFR calc non Af Amer 42 (L) >90 mL/min   GFR calc Af Amer 48 (L) >90 mL/min   Anion gap 10 5 - 15  Troponin I  Result Value Ref Range   Troponin I 0.06 (H) <0.031 ng/mL   Dg Chest 2 View  02/05/2014   CLINICAL DATA:  One week history of difficulty breathing  EXAM: CHEST  2 VIEW  COMPARISON:  April 04, 2012  FINDINGS: There is underlying emphysematous change. There is interstitial edema with cardiomegaly and slight pulmonary venous hypertension. There is no appreciable airspace consolidation. No adenopathy. No bone lesions.  IMPRESSION: Findings consistent with congestive heart failure superimposed on emphysematous change. No airspace consolidation.   Electronically Signed   By: Bretta Bang M.D.   On: 02/05/2014 07:20        EKG Interpretation   Date/Time:  Saturday February 05 2014 07:36:00 EST Ventricular Rate:  110 PR Interval:  159 QRS Duration: 87 QT Interval:  427 QTC Calculation: 578 R Axis:   -57 Text Interpretation:  Sinus or ectopic atrial tachycardia Left axis  deviation Left anterior fascicular block Nonspecific T wave abnormality  Prolonged QT interval Confirmed by Denton Lank  MD, Caryn Bee ([id number]) on 02/05/2014  7:52:23 AM      MDM  Iv lock.  Continuous pulse ox and cardiac monitoring. Ecg.   Labs.  Cxr.  bp very elevated, 175/132, resulting in flash/acute congestive heart failure.  ntg sl, and ntg gtt started to improve bp, and keep chf/pulm edema from rapidly progressing.   cxr noted c/w chf.   Lasix iv.  Given severely elevated bp and chf, pt admitted.    Nitroglycerin gtt titrate to bp improvement.  CRITICAL CARE  RE: severe, uncontrolled hypertension, flash/acute chf, chronic renal insuff w mild aki, mildly elevated troponin Performed by: Suzi Roots Total critical care time: 35 Critical care time was exclusive of separately billable procedures and treating other patients. Critical care was necessary to treat or prevent imminent or  life-threatening deterioration. Critical care was time spent personally by me on the following activities: development of treatment plan with patient and/or surrogate as well as nursing, discussions with consultants, evaluation of patient's response to treatment, examination of patient, obtaining history from patient or surrogate, ordering and performing treatments and interventions, ordering and review of laboratory studies, ordering and review of radiographic studies, pulse oximetry and re-evaluation of patient's condition.     Suzi Roots, MD 02/05/14 [phone]

## 2014-02-05 NOTE — ED Notes (Addendum)
hospitalist wants pt to go to tele floor. Once pt receives morning PO meds, rn to start weaning pts nitro until pt is no longer on nitro drip.   I called and explained this to charge nurse on floor.

## 2014-02-05 NOTE — ED Provider Notes (Signed)
MSE was initiated and I personally evaluated the patient and placed orders (if any) at  6:52 AM on February 05, 2014.  The patient appears stable so that the remainder of the MSE may be completed by another provider.  Shortness of breath. Hx of CHF. Mild tachycardia. No productive cough. Two nitroglycerin tabs given to him by EMS which made him feel better  Lyanne Co, MD 02/05/14 [phone]

## 2014-02-05 NOTE — ED Notes (Signed)
hospitalist at bedside

## 2014-02-05 NOTE — ED Notes (Signed)
Pt alert and oriented x4. Respirations even and unlabored, bilateral symmetrical rise and fall of chest. Skin warm and dry. In no acute distress. Denies needs.   

## 2014-02-05 NOTE — ED Notes (Signed)
RT called

## 2014-02-05 NOTE — ED Notes (Signed)
Per EMS pt comes from home pt stated he was SOB after walking to ambulance. EMS states pt "sounded wet" pt refused IV. Pt reports he is not compliant on blood pressure meds. Total of 0.8 nitro administered sublingual. Pt initially 84 RA came up to 94 on 4L of oxygen. Pt is alert and oriented. Pt has hx of CHF.

## 2014-02-06 LAB — CBC
HCT: 37.3 % — ABNORMAL LOW (ref 39.0–52.0)
Hemoglobin: 12.5 g/dL — ABNORMAL LOW (ref 13.0–17.0)
MCH: 31.1 pg (ref 26.0–34.0)
MCHC: 33.5 g/dL (ref 30.0–36.0)
MCV: 92.8 fL (ref 78.0–100.0)
Platelets: 217 10*3/uL (ref 150–400)
RBC: 4.02 MIL/uL — AB (ref 4.22–5.81)
RDW: 14.1 % (ref 11.5–15.5)
WBC: 5.4 10*3/uL (ref 4.0–10.5)

## 2014-02-06 LAB — COMPREHENSIVE METABOLIC PANEL
ALBUMIN: 3.1 g/dL — AB (ref 3.5–5.2)
ALT: 15 U/L (ref 0–53)
AST: 15 U/L (ref 0–37)
Alkaline Phosphatase: 49 U/L (ref 39–117)
Anion gap: 7 (ref 5–15)
BUN: 24 mg/dL — ABNORMAL HIGH (ref 6–23)
CALCIUM: 8.6 mg/dL (ref 8.4–10.5)
CO2: 27 mmol/L (ref 19–32)
Chloride: 103 mEq/L (ref 96–112)
Creatinine, Ser: 1.74 mg/dL — ABNORMAL HIGH (ref 0.50–1.35)
GFR calc Af Amer: 48 mL/min — ABNORMAL LOW (ref >90)
GFR calc non Af Amer: 41 mL/min — ABNORMAL LOW (ref >90)
GLUCOSE: 101 mg/dL — AB (ref 70–99)
Potassium: 3.3 mmol/L — ABNORMAL LOW (ref 3.5–5.1)
Sodium: 137 mmol/L (ref 135–145)
Total Bilirubin: 0.8 mg/dL (ref 0.3–1.2)
Total Protein: 6.3 g/dL (ref 6.0–8.3)

## 2014-02-06 MED ORDER — LISINOPRIL 40 MG PO TABS: 40.0000 mg | ORAL_TABLET | Freq: Every day | ORAL | Status: DC

## 2014-02-06 MED ORDER — LISINOPRIL 20 MG PO TABS
20.0000 mg | ORAL_TABLET | Freq: Once | ORAL | Status: DC
  Filled 2014-02-06: qty 1

## 2014-02-06 MED ORDER — LISINOPRIL 20 MG PO TABS
20.0000 mg | ORAL_TABLET | Freq: Every day | ORAL | Status: DC
  Filled 2014-02-06: qty 1

## 2014-02-06 MED ORDER — POTASSIUM CHLORIDE CRYS ER 20 MEQ PO TBCR
40.0000 meq | EXTENDED_RELEASE_TABLET | Freq: Two times a day (BID) | ORAL | Status: AC
  Administered 2014-02-06 (×2): 40 meq via ORAL
  Filled 2014-02-06 (×3): qty 2

## 2014-02-06 MED ORDER — ISOSORBIDE MONONITRATE ER 60 MG PO TB24
60.0000 mg | ORAL_TABLET | Freq: Every day | ORAL | Status: DC
  Administered 2014-02-07 – 2014-02-08 (×2): 60 mg via ORAL
  Filled 2014-02-06 (×2): qty 1

## 2014-02-06 MED ORDER — ISOSORBIDE MONONITRATE ER 30 MG PO TB24
30.0000 mg | ORAL_TABLET | Freq: Once | ORAL | Status: AC
  Administered 2014-02-06: 30 mg via ORAL
  Filled 2014-02-06: qty 1

## 2014-02-06 NOTE — Progress Notes (Signed)
[facility] PROGRESS NOTE  Jeffery Hodges ZOX:[id number] DOB: 10/15/1955 DOA: 02/05/2014 PCP: Margretta Ditty, MD  Assessment/Plan: 1. Malignant HTN 1. Initially continued on home nitrate, beta blocker, acei 2. BP remains elevated, primarily diastolic. Will increase Imdur from  to  3. Cont PRN hydralazine 2. Acutely decompensated systolic chf exacerbation 1. Evidence of volume overload on cxr 2. BNP elevated at over 530 3. 2d echo with decreased EF of 40-45% 3. SOB 1. Suspect secondary to mild pulm edema secondary to above 2. Pt does have hyperexpanded lungs in setting of prior tobacco abuse 3. Will empirically continue on scheduled nebs 4. DVT prophylaxis 1. Heparin subQ  Code Status: Full Family Communication: Pt in room Disposition Plan: Pending   Consultants:    Procedures:    Antibiotics:   (indicate start date, and stop date if known)  HPI/Subjective: Pt breathing slightly worsened   Objective: Filed Vitals:   02/06/14 0600 02/06/14 0733 02/06/14 1033 02/06/14 1420  BP: 148/108 144/99 138/113 137/106  Pulse: 92   77  Temp: 98.3 F (36.8 C)   97.7 F (36.5 C)  TempSrc: Oral   Axillary  Resp: 18   20  Height:      Weight:      SpO2: 94%   98%    Intake/Output Summary (Last 24 hours) at 02/06/14 1640 Last data filed at 02/06/14 1430  Gross per 24 hour  Intake    720 ml  Output   2550 ml  Net  -1830 ml   Filed Weights   02/05/14 0943  Weight: 86.728 kg (191 lb 3.2 oz)    Exam:   General:  Awake, in nad  Cardiovascular: regular, s1, s2  Respiratory: normal resp effort, no wheezing  Abdomen: soft, nondistended  Musculoskeletal: perfused, no clubbing   Data Reviewed: Basic Metabolic Panel:  Recent Labs Lab 02/05/14 0700 02/06/14 0456  NA 139 137  K 4.5 3.3*  CL 104 103  CO2 25 27  GLUCOSE 110* 101*  BUN 21 24*  CREATININE 1.72* 1.74*  CALCIUM 8.5 8.6   Liver Function Tests:  Recent Labs Lab 02/05/14 0700  02/06/14 0456  AST 23 15  ALT 16 15  ALKPHOS 56 49  BILITOT 0.8 0.8  PROT 6.6 6.3  ALBUMIN 3.1* 3.1*   No results for input(s): LIPASE, AMYLASE in the last 168 hours. No results for input(s): AMMONIA in the last 168 hours. CBC:  Recent Labs Lab 02/05/14 0700 02/06/14 0456  WBC 3.9* 5.4  NEUTROABS 2.0  --   HGB 12.4* 12.5*  HCT 38.2* 37.3*  MCV 94.3 92.8  PLT 250 217   Cardiac Enzymes:  Recent Labs Lab 02/05/14 0700  TROPONINI 0.06*   BNP (last 3 results) No results for input(s): PROBNP in the last 8760 hours. CBG: No results for input(s): GLUCAP in the last 168 hours.  No results found for this or any previous visit (from the past 240 hour(s)).   Studies: Dg Chest 2 View  02/05/2014   CLINICAL DATA:  One week history of difficulty breathing  EXAM: CHEST  2 VIEW  COMPARISON:  April 04, 2012  FINDINGS: There is underlying emphysematous change. There is interstitial edema with cardiomegaly and slight pulmonary venous hypertension. There is no appreciable airspace consolidation. No adenopathy. No bone lesions.  IMPRESSION: Findings consistent with congestive heart failure superimposed on emphysematous change. No airspace consolidation.   Electronically Signed   By: Bretta Bang M.D.   On: 02/05/2014 07:20  Scheduled Meds: . aspirin  81 mg Oral Daily  . carvedilol  12.5 mg Oral BID WC  . furosemide  40 mg Intravenous Daily  . heparin  5,000 Units Subcutaneous 3 times per day  . [START ON 02/07/2014] isosorbide mononitrate  60 mg Oral Daily  . [START ON 02/07/2014] lisinopril  20 mg Oral Daily  . potassium chloride  40 mEq Oral BID  . pravastatin  20 mg Oral q1800  . sodium chloride  3 mL Intravenous Q12H   Continuous Infusions:   Principal Problem:   Malignant hypertension Active Problems:   Uncontrolled hypertension   Acute diastolic CHF (congestive heart failure)   SOB (shortness of breath)  Time spent:  Dyan Labarbera K  [facility] Pager [phone]. If 7PM-7AM, please contact night-coverage at [email or web address], password [facility] 02/06/2014, 4:40 PM  LOS: 1 day

## 2014-02-07 LAB — BASIC METABOLIC PANEL
Anion gap: 7 (ref 5–15)
BUN: 23 mg/dL (ref 6–23)
CO2: 28 mmol/L (ref 19–32)
Calcium: 8.9 mg/dL (ref 8.4–10.5)
Chloride: 104 mEq/L (ref 96–112)
Creatinine, Ser: 1.95 mg/dL — ABNORMAL HIGH (ref 0.50–1.35)
GFR calc Af Amer: 42 mL/min — ABNORMAL LOW (ref >90)
GFR, EST NON AFRICAN AMERICAN: 36 mL/min — AB (ref >90)
Glucose, Bld: 101 mg/dL — ABNORMAL HIGH (ref 70–99)
Potassium: 4.1 mmol/L (ref 3.5–5.1)
Sodium: 139 mmol/L (ref 135–145)

## 2014-02-07 LAB — CBC
HEMATOCRIT: 39.4 % (ref 39.0–52.0)
Hemoglobin: 13 g/dL (ref 13.0–17.0)
MCH: 30.9 pg (ref 26.0–34.0)
MCHC: 33 g/dL (ref 30.0–36.0)
MCV: 93.6 fL (ref 78.0–100.0)
PLATELETS: 240 10*3/uL (ref 150–400)
RBC: 4.21 MIL/uL — ABNORMAL LOW (ref 4.22–5.81)
RDW: 14.2 % (ref 11.5–15.5)
WBC: 4.7 10*3/uL (ref 4.0–10.5)

## 2014-02-07 MED ORDER — AMLODIPINE BESYLATE 5 MG PO TABS
5.0000 mg | ORAL_TABLET | Freq: Every day | ORAL | Status: DC
  Administered 2014-02-07 – 2014-02-08 (×2): 5 mg via ORAL
  Filled 2014-02-07 (×2): qty 1

## 2014-02-07 MED ORDER — CARVEDILOL 25 MG PO TABS
25.0000 mg | ORAL_TABLET | Freq: Two times a day (BID) | ORAL | Status: DC
  Administered 2014-02-07 – 2014-02-08 (×3): 25 mg via ORAL
  Filled 2014-02-07 (×3): qty 1

## 2014-02-07 MED ORDER — FUROSEMIDE 40 MG PO TABS: 40.0000 mg | ORAL_TABLET | Freq: Every day | ORAL | Status: DC

## 2014-02-07 NOTE — Progress Notes (Signed)
[facility] PROGRESS NOTE  Jeffery Hodges HUT:[id number] DOB: 24-Mar-1955 DOA: 02/05/2014 PCP: Margretta Ditty, MD  Assessment/Plan: 1. Malignant HTN 1. Initially continued on home nitrate, beta blocker 2. BP remains elevated, primarily diastolic. Will increase Imdur from 30mg  to 60mg  3. Will stop ACEI secondary to increased Cr overnight 4. Instead, will start norvasc 5mg  daily 5. Cont PRN hydralazine 2. Acutely decompensated systolic chf exacerbation 1. Evidence of volume overload on cxr 2. BNP elevated at over 530 3. 2d echo with decreased EF of 40-45% 3. SOB 1. Suspect secondary to mild pulm edema secondary to above 2. Pt does have hyperexpanded lungs in setting of prior tobacco abuse 3. Will empirically continue on scheduled nebs 4. DVT prophylaxis 1. Heparin subQ  Code Status: Full Family Communication: Pt in room Disposition Plan: Pending   Consultants:    Procedures:    Antibiotics:  none  HPI/Subjective: Pt breathing better today.  Objective: Filed Vitals:   02/07/14 0810 02/07/14 0845 02/07/14 1100 02/07/14 1358  BP: 149/105 149/105 136/93 128/83  Pulse: 77 77 69 72  Temp: 97.9 F (36.6 C)   97.6 F (36.4 C)  TempSrc: Oral   Oral  Resp: 18   18  Height:      Weight:      SpO2: 96%   96%    Intake/Output Summary (Last 24 hours) at 02/07/14 1446 Last data filed at 02/07/14 1404  Gross per 24 hour  Intake   1065 ml  Output   1505 ml  Net   -440 ml   Filed Weights   02/05/14 0943 02/06/14 1731 02/07/14 0420  Weight: 86.728 kg (191 lb 3.2 oz) 86.274 kg (190 lb 3.2 oz) 87 kg (191 lb 12.8 oz)    Exam:   General:  Awake, in nad  Cardiovascular: regular, s1, s2  Respiratory: normal resp effort, no wheezing  Abdomen: soft, nondistended  Musculoskeletal: perfused, no clubbing   Data Reviewed: Basic Metabolic Panel:  Recent Labs Lab 02/05/14 0700 02/06/14 0456 02/07/14 0405  NA 139 137 139  K 4.5 3.3* 4.1  CL 104 103 104   CO2 25 27 28   GLUCOSE 110* 101* 101*  BUN 21 24* 23  CREATININE 1.72* 1.74* 1.95*  CALCIUM 8.5 8.6 8.9   Liver Function Tests:  Recent Labs Lab 02/05/14 0700 02/06/14 0456  AST 23 15  ALT 16 15  ALKPHOS 56 49  BILITOT 0.8 0.8  PROT 6.6 6.3  ALBUMIN 3.1* 3.1*   No results for input(s): LIPASE, AMYLASE in the last 168 hours. No results for input(s): AMMONIA in the last 168 hours. CBC:  Recent Labs Lab 02/05/14 0700 02/06/14 0456 02/07/14 0405  WBC 3.9* 5.4 4.7  NEUTROABS 2.0  --   --   HGB 12.4* 12.5* 13.0  HCT 38.2* 37.3* 39.4  MCV 94.3 92.8 93.6  PLT 250 217 240   Cardiac Enzymes:  Recent Labs Lab 02/05/14 0700  TROPONINI 0.06*   BNP (last 3 results) No results for input(s): PROBNP in the last 8760 hours. CBG: No results for input(s): GLUCAP in the last 168 hours.  No results found for this or any previous visit (from the past 240 hour(s)).   Studies: No results found.  Scheduled Meds: . amLODipine  5 mg Oral Daily  . aspirin  81 mg Oral Daily  . carvedilol  25 mg Oral BID WC  . heparin  5,000 Units Subcutaneous 3 times per day  . isosorbide mononitrate  60 mg Oral  Daily  . pravastatin  20 mg Oral q1800  . sodium chloride  3 mL Intravenous Q12H   Continuous Infusions:   Principal Problem:   Malignant hypertension Active Problems:   Uncontrolled hypertension   Acute diastolic CHF (congestive heart failure)   SOB (shortness of breath)  Time spent:  CHIU, STEPHEN K  [facility] Pager [phone]. If 7PM-7AM, please contact night-coverage at [email or web address], password [facility] 02/07/2014, 2:46 PM  LOS: 2 days

## 2014-02-08 LAB — BASIC METABOLIC PANEL
Anion gap: 7 (ref 5–15)
BUN: 20 mg/dL (ref 6–23)
CHLORIDE: 105 meq/L (ref 96–112)
CO2: 27 mmol/L (ref 19–32)
Calcium: 8.9 mg/dL (ref 8.4–10.5)
Creatinine, Ser: 1.66 mg/dL — ABNORMAL HIGH (ref 0.50–1.35)
GFR calc non Af Amer: 44 mL/min — ABNORMAL LOW (ref >90)
GFR, EST AFRICAN AMERICAN: 51 mL/min — AB (ref >90)
Glucose, Bld: 106 mg/dL — ABNORMAL HIGH (ref 70–99)
POTASSIUM: 4.2 mmol/L (ref 3.5–5.1)
Sodium: 139 mmol/L (ref 135–145)

## 2014-02-08 MED ORDER — AMLODIPINE BESYLATE 5 MG PO TABS: 5.0000 mg | ORAL_TABLET | Freq: Every day | ORAL | Status: DC

## 2014-02-08 MED ORDER — CARVEDILOL 25 MG PO TABS: 25.0000 mg | ORAL_TABLET | Freq: Two times a day (BID) | ORAL | Status: DC

## 2014-02-08 MED ORDER — ISOSORBIDE MONONITRATE ER 60 MG PO TB24: 60.0000 mg | ORAL_TABLET | Freq: Every day | ORAL | Status: DC

## 2014-02-08 NOTE — Discharge Summary (Signed)
Physician Discharge Summary  Jeffery Hodges YQM:[id number] DOB: 05/13/1955 DOA: 02/05/2014  PCP: Margretta Ditty, MD  Admit date: 02/05/2014 Discharge date: 02/08/2014  Time spent: 30 minutes  Recommendations for Outpatient Follow-up:  1. Follow up with PCP in 1-2 weeks  Discharge Diagnoses:  Principal Problem:   Malignant hypertension Active Problems:   Uncontrolled hypertension   Acute diastolic CHF (congestive heart failure)   SOB (shortness of breath)   Discharge Condition: Improved  Diet recommendation: heart healthy  Filed Weights   02/06/14 1731 02/07/14 0420 02/08/14 0539  Weight: 86.274 kg (190 lb 3.2 oz) 87 kg (191 lb 12.8 oz) 87.363 kg (192 lb 9.6 oz)    History of present illness:  Pleas see admit h and p from 02/05/14 for details. Briefly, pt presents with malignant htn and evidence of volume overload and sob. Pt was admitted for further work up.  Hospital Course:  1. Malignant HTN 1. Initially continued on home nitrate, beta blocker 2. BP remained elevated, primarily diastolic. Thus, increased Imdur from  to  3. Stopped ACEI secondary to increased Cr during this admit 4. Started norvasc  daily instead 5. BP now much improved 6. Cont PRN hydralazine 2. Acutely decompensated systolic chf exacerbation 1. Evidence of volume overload on cxr 2. BNP elevated at over 530 3. 2d echo with decreased EF of 40-45% 3. SOB 1. Suspect secondary to mild pulm edema secondary to above 2. Pt does have hyperexpanded lungs in setting of prior tobacco abuse 3. Empirically continued on scheduled nebs while inpatient 4. Resolved with diuresis 4. DVT prophylaxis 1. Heparin subQ  Consultations:  none  Discharge Exam: Filed Vitals:   02/07/14 1358 02/07/14 2025 02/08/14 0539 02/08/14 0919  BP: 128/83 128/91 145/108 127/92  Pulse: 72 69 65   Temp: 97.6 F (36.4 C) 97.9 F (36.6 C) 97.9 F (36.6 C)   TempSrc: Oral Oral Oral   Resp: Height:       Weight:   87.363 kg (192 lb 9.6 oz)   SpO2: 96% 98% 98%     General: awake, in nad Cardiovascular: regular, s1, s2 Respiratory: normal resp effort, no wheezing  Discharge Instructions     Medication List    STOP taking these medications        lisinopril 20 MG tablet  Commonly known as:  PRINIVIL,ZESTRIL     POTASSIUM CHLORIDE PO     potassium chloride SA 20 MEQ tablet  Commonly known as:  K-DUR,KLOR-CON      TAKE these medications        amLODipine 5 MG tablet  Commonly known as:  NORVASC  Take 1 tablet (5 mg total) by mouth daily.     aspirin 81 MG tablet  Take 81 mg by mouth daily.     carvedilol 25 MG tablet  Commonly known as:  COREG  Take 1 tablet (25 mg total) by mouth 2 (two) times daily with a meal.     furosemide 40 MG tablet  Commonly known as:  LASIX  Take 1 tablet (40 mg total) by mouth daily.     isosorbide mononitrate 60 MG 24 hr tablet  Commonly known as:  IMDUR  Take 1 tablet (60 mg total) by mouth daily.     pravastatin 20 MG tablet  Commonly known as:  PRAVACHOL  Take one daily for cholesterol       No Known Allergies Follow-up Information    Follow up with Margretta Ditty,  MD. Schedule an appointment as soon as possible for a visit in 1 week.   Specialty:  Family Medicine   Contact information:   [phone] [facility] [address] [place] [address] [phone]        The results of significant diagnostics from this hospitalization (including imaging, microbiology, ancillary and laboratory) are listed below for reference.    Significant Diagnostic Studies: Dg Chest 2 View  02/05/2014   CLINICAL DATA:  One week history of difficulty breathing  EXAM: CHEST  2 VIEW  COMPARISON:  April 04, 2012  FINDINGS: There is underlying emphysematous change. There is interstitial edema with cardiomegaly and slight pulmonary venous hypertension. There is no appreciable airspace consolidation. No adenopathy. No bone lesions.  IMPRESSION:  Findings consistent with congestive heart failure superimposed on emphysematous change. No airspace consolidation.   Electronically Signed   By: Bretta Bang M.D.   On: 02/05/2014 07:20    Microbiology: No results found for this or any previous visit (from the past 240 hour(s)).   Labs: Basic Metabolic Panel:  Recent Labs Lab 02/05/14 0700 02/06/14 0456 02/07/14 0405 02/08/14 0452  NA 139 137 139 139  K 4.5 3.3* 4.1 4.2  CL 104 103 104 105  CO2 25 27 28 27   GLUCOSE 110* 101* 101* 106*  BUN 21 24* 23 20  CREATININE 1.72* 1.74* 1.95* 1.66*  CALCIUM 8.5 8.6 8.9 8.9   Liver Function Tests:  Recent Labs Lab 02/05/14 0700 02/06/14 0456  AST 23 15  ALT 16 15  ALKPHOS 56 49  BILITOT 0.8 0.8  PROT 6.6 6.3  ALBUMIN 3.1* 3.1*   No results for input(s): LIPASE, AMYLASE in the last 168 hours. No results for input(s): AMMONIA in the last 168 hours. CBC:  Recent Labs Lab 02/05/14 0700 02/06/14 0456 02/07/14 0405  WBC 3.9* 5.4 4.7  NEUTROABS 2.0  --   --   HGB 12.4* 12.5* 13.0  HCT 38.2* 37.3* 39.4  MCV 94.3 92.8 93.6  PLT 250 217 240   Cardiac Enzymes:  Recent Labs Lab 02/05/14 0700  TROPONINI 0.06*   BNP: BNP (last 3 results) No results for input(s): PROBNP in the last 8760 hours. CBG: No results for input(s): GLUCAP in the last 168 hours.  Signed:  Rito Lecomte, Scheryl Marten  [facility] 02/08/2014, 12:42 PM

## 2014-02-08 NOTE — Progress Notes (Signed)
Pt has insurance coverage for medications and states that he will get his medications.

## 2014-05-07 ENCOUNTER — Other Ambulatory Visit: Payer: Self-pay | Admitting: Family Medicine

## 2014-05-10 ENCOUNTER — Other Ambulatory Visit: Payer: Self-pay | Admitting: Family Medicine

## 2014-05-16 ENCOUNTER — Telehealth: Payer: Self-pay

## 2014-05-16 NOTE — Telephone Encounter (Signed)
furosemide (LASIX) 40 MG tablet  Out of medication for a week  [place] - [facility]  - [phone]  [phone]

## 2014-05-17 NOTE — Telephone Encounter (Signed)
Needs office visit for refills. He was reminded on last refill and I dont see an appt. Left message for pt to call back.

## 2014-05-18 NOTE — Telephone Encounter (Signed)
Pt called back, he is  Going to see when he can come in.

## 2014-05-18 NOTE — Telephone Encounter (Signed)
Spoke with pt, he will call me back at lunch.

## 2014-06-10 ENCOUNTER — Other Ambulatory Visit: Payer: Self-pay | Admitting: Family Medicine

## 2014-07-22 ENCOUNTER — Other Ambulatory Visit: Payer: Self-pay | Admitting: Family Medicine

## 2014-09-16 ENCOUNTER — Encounter (HOSPITAL_BASED_OUTPATIENT_CLINIC_OR_DEPARTMENT_OTHER): Payer: Self-pay | Admitting: Emergency Medicine

## 2014-09-16 ENCOUNTER — Emergency Department (HOSPITAL_BASED_OUTPATIENT_CLINIC_OR_DEPARTMENT_OTHER)
Admission: EM | Admit: 2014-09-16 | Discharge: 2014-09-16 | Disposition: A | Discharge status: Home / Self Care | Payer: No Typology Code available for payment source | Attending: Emergency Medicine | Admitting: Emergency Medicine

## 2014-09-16 DIAGNOSIS — I10 Essential (primary) hypertension: Secondary | ICD-10-CM

## 2014-09-16 DIAGNOSIS — I509 Heart failure, unspecified: Secondary | ICD-10-CM

## 2014-09-16 DIAGNOSIS — I129 Hypertensive chronic kidney disease with stage 1 through stage 4 chronic kidney disease, or unspecified chronic kidney disease: Secondary | ICD-10-CM | POA: Insufficient documentation

## 2014-09-16 DIAGNOSIS — N189 Chronic kidney disease, unspecified: Secondary | ICD-10-CM | POA: Insufficient documentation

## 2014-09-16 DIAGNOSIS — Z760 Encounter for issue of repeat prescription: Secondary | ICD-10-CM | POA: Insufficient documentation

## 2014-09-16 DIAGNOSIS — I5022 Chronic systolic (congestive) heart failure: Secondary | ICD-10-CM | POA: Insufficient documentation

## 2014-09-16 DIAGNOSIS — E785 Hyperlipidemia, unspecified: Secondary | ICD-10-CM | POA: Insufficient documentation

## 2014-09-16 DIAGNOSIS — Z7982 Long term (current) use of aspirin: Secondary | ICD-10-CM | POA: Insufficient documentation

## 2014-09-16 DIAGNOSIS — Z79899 Other long term (current) drug therapy: Secondary | ICD-10-CM | POA: Insufficient documentation

## 2014-09-16 DIAGNOSIS — Z87891 Personal history of nicotine dependence: Secondary | ICD-10-CM | POA: Insufficient documentation

## 2014-09-16 LAB — BASIC METABOLIC PANEL
Anion gap: 10 (ref 5–15)
BUN: 31 mg/dL — AB (ref 6–20)
CO2: 20 mmol/L — ABNORMAL LOW (ref 22–32)
CREATININE: 2.04 mg/dL — AB (ref 0.61–1.24)
Calcium: 8.8 mg/dL — ABNORMAL LOW (ref 8.9–10.3)
Chloride: 108 mmol/L (ref 101–111)
GFR, EST AFRICAN AMERICAN: 39 mL/min — AB (ref >60)
GFR, EST NON AFRICAN AMERICAN: 34 mL/min — AB (ref >60)
Glucose, Bld: 99 mg/dL (ref 65–99)
POTASSIUM: 4 mmol/L (ref 3.5–5.1)
Sodium: 138 mmol/L (ref 135–145)

## 2014-09-16 MED ORDER — FUROSEMIDE 40 MG PO TABS: 40.0000 mg | ORAL_TABLET | Freq: Every day | ORAL | Status: DC

## 2014-09-16 MED ORDER — AMLODIPINE BESYLATE 5 MG PO TABS: 5.0000 mg | ORAL_TABLET | Freq: Every day | ORAL | Status: DC

## 2014-09-16 MED ORDER — ISOSORBIDE MONONITRATE ER 30 MG PO TB24: 30.0000 mg | ORAL_TABLET | Freq: Every day | ORAL | Status: DC

## 2014-09-16 MED ORDER — ALBUTEROL SULFATE (2.5 MG/3ML) 0.083% IN NEBU
2.5000 mg | INHALATION_SOLUTION | Freq: Once | RESPIRATORY_TRACT | Status: AC
  Administered 2014-09-16: 2.5 mg via RESPIRATORY_TRACT
  Filled 2014-09-16: qty 3

## 2014-09-16 MED ORDER — CARVEDILOL 12.5 MG PO TABS: 12.5000 mg | ORAL_TABLET | Freq: Two times a day (BID) | ORAL | Status: DC

## 2014-09-16 MED ORDER — PRAVASTATIN SODIUM 20 MG PO TABS: 20.0000 mg | ORAL_TABLET | Freq: Every day | ORAL | Status: DC

## 2014-09-16 NOTE — ED Provider Notes (Signed)
CSN: [id number]     Arrival date & time 09/16/14  1004 History   First MD Initiated Contact with Patient 09/16/14 1016     Chief Complaint  Patient presents with  . Shortness of Breath  . Medication Refill      HPI   Patient presents evaluation of "I'm out of my medicines". History of hypertension congestive heart failure. Essentially not been on his medicine since January since he can't afford them. Ran out of his insurance in June. Frequent cough. Frequently easily short of breath no chest pain or worsening symptoms over the last several days.  Distant history of an EF of 30 did improve with treatment to an EF of 55.   Past Medical History  Diagnosis Date  . ED (erectile dysfunction)   . HTN (hypertension)   . Chronic systolic CHF (congestive heart failure)     EF 30% in the past => improved to normal;  echo 6/13:  Mod LVH, EF 55%, Gr 1 DD, mild LAE, normal RV sized and fxn  . Chronic kidney disease   . Hyperlipidemia   . Cardiomyopathy     likley HTN;  EF 30% => improved to normal (echo in 2012 and 2013 with normal LVF);  myoview 2004: inf defect likely diaph atten, no ischemia   History reviewed. No pertinent past surgical history. History reviewed. No pertinent family history. Social History  Substance Use Topics  . Smoking status: Former Games developer  . Smokeless tobacco: None     Comment: PATIENT QUITE ABOUT A YEAR NOW  . Alcohol Use: 1.8 oz/week    3 Cans of beer per week     Comment: daily    Review of Systems  Constitutional: Negative for fever, chills, diaphoresis, appetite change and fatigue.  HENT: Negative for mouth sores, sore throat and trouble swallowing.   Eyes: Negative for visual disturbance.  Respiratory: Negative for cough, chest tightness, shortness of breath and wheezing.        Dyspnea on exertion.  Cardiovascular: Negative for chest pain.  Gastrointestinal: Negative for nausea, vomiting, abdominal pain, diarrhea and abdominal distention.   Endocrine: Negative for polydipsia, polyphagia and polyuria.  Genitourinary: Negative for dysuria, frequency and hematuria.  Musculoskeletal: Negative for gait problem.  Skin: Negative for color change, pallor and rash.  Neurological: Negative for dizziness, syncope, light-headedness and headaches.  Hematological: Does not bruise/bleed easily.  Psychiatric/Behavioral: Negative for behavioral problems and confusion.      Allergies  Review of patient's allergies indicates no known allergies.  Home Medications   Prior to Admission medications   Medication Sig Start Date End Date Taking? Authorizing Provider  amLODipine (NORVASC) 5 MG tablet Take 1 tablet (5 mg total) by mouth daily. 09/16/14   Rolland Porter, MD  aspirin 81 MG tablet Take 81 mg by mouth daily.      Historical Provider, MD  carvedilol (COREG) 12.5 MG tablet Take 1 tablet (12.5 mg total) by mouth 2 (two) times daily with a meal. 09/16/14   Rolland Porter, MD  furosemide (LASIX) 40 MG tablet Take 1 tablet (40 mg total) by mouth daily. 09/16/14   Rolland Porter, MD  isosorbide mononitrate (IMDUR) 30 MG 24 hr tablet Take 1 tablet (30 mg total) by mouth daily. 09/16/14   Rolland Porter, MD  pravastatin (PRAVACHOL) 20 MG tablet Take 1 tablet (20 mg total) by mouth daily. 09/16/14   Rolland Porter, MD   BP 163/131 mmHg  Pulse 96  Temp(Src) 98 F (36.7 C) (Oral)  Resp 18  Ht 5\' 8"  (1.727 m)  Wt 185 lb (83.915 kg)  BMI 28.14 kg/m2  SpO2 98% Physical Exam  Constitutional: He is oriented to person, place, and time. He appears well-developed and well-nourished. No distress.  HENT:  Head: Normocephalic.  Eyes: Conjunctivae are normal. Pupils are equal, round, and reactive to light. No scleral icterus.  Neck: Normal range of motion. Neck supple. No thyromegaly present.  Cardiovascular: Normal rate and regular rhythm.  Exam reveals no gallop and no friction rub.   No murmur heard. Pulmonary/Chest: Effort normal and breath sounds normal. No  respiratory distress. He has no wheezes. He has no rales.  Lungs. No crackles or rales. No wheezing or prolongation. No dependent edema. No S3 or 4 gallop. No JVD.  Abdominal: Soft. Bowel sounds are normal. He exhibits no distension. There is no tenderness. There is no rebound.  Musculoskeletal: Normal range of motion.  Neurological: He is alert and oriented to person, place, and time.  Skin: Skin is warm and dry. No rash noted.  Psychiatric: He has a normal mood and affect. His behavior is normal.    ED Course  Procedures (including critical care time) Labs Review Labs Reviewed  BASIC METABOLIC PANEL - Abnormal; Notable for the following:    CO2 20 (*)    BUN 31 (*)    Creatinine, Ser 2.04 (*)    Calcium 8.8 (*)    GFR calc non Af Amer 34 (*)    GFR calc Af Amer 39 (*)    All other components within normal limits    Imaging Review No results found. I have personally reviewed and evaluated these images and lab results as part of my medical decision-making.   EKG Interpretation   Date/Time:  Friday September 16 2014 10:48:02 EDT Ventricular Rate:  85 PR Interval:  176 QRS Duration: 72 QT Interval:  362 QTC Calculation: 430 R Axis:   -63 Text Interpretation:  Normal sinus rhythm Possible Left atrial enlargement  Left axis deviation Nonspecific T wave abnormality Abnormal ECG Confirmed  by Fayrene Fearing  MD, Tola Meas ([id number]) on 09/16/2014 12:25:40 PM      MDM   Final diagnoses:  Essential hypertension  Congestive heart failure, unspecified congestive heart failure chronicity, unspecified congestive heart failure type    No signs of acute congestive heart failure here. I spoke with her pharmacist. He is reviewed with the patient the patient's insurance status. We are able to arrange an affordable alternative for his medications here and was discharged to go directly to our pharmacy to fill these medications. Is urged to be compliant. He asks "what am I supposed to do when I run out of  medications". I discussed with him in no uncertain terms that I did not want the emergency room continue to function as his primary care physician and cardiologist and medicines supplier. It is stressed him the importance of following up with the appropriate physicians. Was given community resources.    Rolland Porter, MD 09/16/14 [phone]

## 2014-09-16 NOTE — ED Notes (Signed)
Patient states he has been out of medication since June, 2016, which were Lisinopril 20mg , Isosorbide, furosemide 40mg .  Pt states she has at [facility] in January, 2016 and didn't get medications filled.  He is requesting some if not all medication be refilled.  PT c/o of shortness of breath for about a month.  Pt states the [facility] is getting worse.  PT denies chest pain, Nausea or weakness.

## 2014-09-16 NOTE — ED Notes (Signed)
Patient and family walked to pharmacy to pick medications.

## 2014-09-16 NOTE — Discharge Instructions (Signed)
Hypertension Hypertension is another name for high blood pressure. High blood pressure forces your heart to work harder to pump blood. A blood pressure reading has two numbers, which includes a higher number over a lower number (example: 110/72). HOME CARE   Have your blood pressure rechecked by your doctor.  Only take medicine as told by your doctor. Follow the directions carefully. The medicine does not work as well if you skip doses. Skipping doses also puts you at risk for problems.  Do not smoke.  Monitor your blood pressure at home as told by your doctor. GET HELP IF:  You think you are having a reaction to the medicine you are taking.  You have repeat headaches or feel dizzy.  You have puffiness (swelling) in your ankles.  You have trouble with your vision. GET HELP RIGHT AWAY IF:   You get a very bad headache and are confused.  You feel weak, numb, or faint.  You get chest or belly (abdominal) pain.  You throw up (vomit).  You cannot breathe very well. MAKE SURE YOU:   Understand these instructions.  Will watch your condition.  Will get help right away if you are not doing well or get worse. Document Released: 07/03/2007 Document Revised: 01/19/2013 Document Reviewed: 11/06/2012 [facility] Patient Information 2015 [place], [place]. This information is not intended to replace advice given to you by your health care provider. Make sure you discuss any questions you have with your health care provider.  Heart Failure Heart failure is a condition in which the heart has trouble pumping blood. This means your heart does not pump blood efficiently for your body to work well. In some cases of heart failure, fluid may back up into your lungs or you may have swelling (edema) in your lower legs. Heart failure is usually a long-term (chronic) condition. It is important for you to take good care of yourself and follow your health care provider's treatment plan. CAUSES  Some health  conditions can cause heart failure. Those health conditions include:  High blood pressure (hypertension). Hypertension causes the heart muscle to work harder than normal. When pressure in the blood vessels is high, the heart needs to pump (contract) with more force in order to circulate blood throughout the body. High blood pressure eventually causes the heart to become stiff and weak.  Coronary artery disease (CAD). CAD is the buildup of cholesterol and fat (plaque) in the arteries of the heart. The blockage in the arteries deprives the heart muscle of oxygen and blood. This can cause chest pain and may lead to a heart attack. High blood pressure can also contribute to CAD.  Heart attack (myocardial infarction). A heart attack occurs when one or more arteries in the heart become blocked. The loss of oxygen damages the muscle tissue of the heart. When this happens, part of the heart muscle dies. The injured tissue does not contract as well and weakens the heart's ability to pump blood.  Abnormal heart valves. When the heart valves do not open and close properly, it can cause heart failure. This makes the heart muscle pump harder to keep the blood flowing.  Heart muscle disease (cardiomyopathy or myocarditis). Heart muscle disease is damage to the heart muscle from a variety of causes. These can include drug or alcohol abuse, infections, or unknown reasons. These can increase the risk of heart failure.  Lung disease. Lung disease makes the heart work harder because the lungs do not work properly. This can cause  a strain on the heart, leading it to fail.  Diabetes. Diabetes increases the risk of heart failure. High blood sugar contributes to high fat (lipid) levels in the blood. Diabetes can also cause slow damage to tiny blood vessels that carry important nutrients to the heart muscle. When the heart does not get enough oxygen and food, it can cause the heart to become weak and stiff. This leads to a  heart that does not contract efficiently.  Other conditions can contribute to heart failure. These include abnormal heart rhythms, thyroid problems, and low blood counts (anemia). Certain unhealthy behaviors can increase the risk of heart failure, including:  Being overweight.  Smoking or chewing tobacco.  Eating foods high in fat and cholesterol.  Abusing illicit drugs or alcohol.  Lacking physical activity. SYMPTOMS  Heart failure symptoms may vary and can be hard to detect. Symptoms may include:  Shortness of breath with activity, such as climbing stairs.  Persistent cough.  Swelling of the feet, ankles, legs, or abdomen.  Unexplained weight gain.  Difficulty breathing when lying flat (orthopnea).  Waking from sleep because of the need to sit up and get more air.  Rapid heartbeat.  Fatigue and loss of energy.  Feeling light-headed, dizzy, or close to fainting.  Loss of appetite.  Nausea.  Increased urination during the night (nocturia). DIAGNOSIS  A diagnosis of heart failure is based on your history, symptoms, physical examination, and diagnostic tests. Diagnostic tests for heart failure may include:  Echocardiography.  Electrocardiography.  Chest X-ray.  Blood tests.  Exercise stress test.  Cardiac angiography.  Radionuclide scans. TREATMENT  Treatment is aimed at managing the symptoms of heart failure. Medicines, behavioral changes, or surgical intervention may be necessary to treat heart failure.  Medicines to help treat heart failure may include:  Angiotensin-converting enzyme (ACE) inhibitors. This type of medicine blocks the effects of a blood protein called angiotensin-converting enzyme. ACE inhibitors relax (dilate) the blood vessels and help lower blood pressure.  Angiotensin receptor blockers (ARBs). This type of medicine blocks the actions of a blood protein called angiotensin. Angiotensin receptor blockers dilate the blood vessels and  help lower blood pressure.  Water pills (diuretics). Diuretics cause the kidneys to remove salt and water from the blood. The extra fluid is removed through urination. This loss of extra fluid lowers the volume of blood the heart pumps.  Beta blockers. These prevent the heart from beating too fast and improve heart muscle strength.  Digitalis. This increases the force of the heartbeat.  Healthy behavior changes include:  Obtaining and maintaining a healthy weight.  Stopping smoking or chewing tobacco.  Eating heart-healthy foods.  Limiting or avoiding alcohol.  Stopping illicit drug use.  Physical activity as directed by your health care provider.  Surgical treatment for heart failure may include:  A procedure to open blocked arteries, repair damaged heart valves, or remove damaged heart muscle tissue.  A pacemaker to improve heart muscle function and control certain abnormal heart rhythms.  An internal cardioverter defibrillator to treat certain serious abnormal heart rhythms.  A left ventricular assist device (LVAD) to assist the pumping ability of the heart. HOME CARE INSTRUCTIONS   Take medicines only as directed by your health care provider. Medicines are important in reducing the workload of your heart, slowing the progression of heart failure, and improving your symptoms.  Do not stop taking your medicine unless directed by your health care provider.  Do not skip any dose of medicine.  Refill your  prescriptions before you run out of medicine. Your medicines are needed every day.  Engage in moderate physical activity if directed by your health care provider. Moderate physical activity can benefit some people. The elderly and people with severe heart failure should consult with a health care provider for physical activity recommendations.  Eat heart-healthy foods. Food choices should be free of trans fat and low in saturated fat, cholesterol, and salt (sodium). Healthy  choices include fresh or frozen fruits and vegetables, fish, lean meats, legumes, fat-free or low-fat dairy products, and whole grain or high fiber foods. Talk to a dietitian to learn more about heart-healthy foods.  Limit sodium if directed by your health care provider. Sodium restriction may reduce symptoms of heart failure in some people. Talk to a dietitian to learn more about heart-healthy seasonings.  Use healthy cooking methods. Healthy cooking methods include roasting, grilling, broiling, baking, poaching, steaming, or stir-frying. Talk to a dietitian to learn more about healthy cooking methods.  Limit fluids if directed by your health care provider. Fluid restriction may reduce symptoms of heart failure in some people.  Weigh yourself every day. Daily weights are important in the early recognition of excess fluid. You should weigh yourself every morning after you urinate and before you eat breakfast. Wear the same amount of clothing each time you weigh yourself. Record your daily weight. Provide your health care provider with your weight record.  Monitor and record your blood pressure if directed by your health care provider.  Check your pulse if directed by your health care provider.  Lose weight if directed by your health care provider. Weight loss may reduce symptoms of heart failure in some people.  Stop smoking or chewing tobacco. Nicotine makes your heart work harder by causing your blood vessels to constrict. Do not use nicotine gum or patches before talking to your health care provider.  Keep all follow-up visits as directed by your health care provider. This is important.  Limit alcohol intake to no more than 1 drink per day for nonpregnant women and 2 drinks per day for men. One drink equals 12 ounces of beer, 5 ounces of wine, or 1 ounces of hard liquor. Drinking more than that is harmful to your heart. Tell your health care provider if you drink alcohol several times a week.  Talk with your health care provider about whether alcohol is safe for you. If your heart has already been damaged by alcohol or you have severe heart failure, drinking alcohol should be stopped completely.  Stop illicit drug use.  Stay up-to-date with immunizations. It is especially important to prevent respiratory infections through current pneumococcal and influenza immunizations.  Manage other health conditions such as hypertension, diabetes, thyroid disease, or abnormal heart rhythms as directed by your health care provider.  Learn to manage stress.  Plan rest periods when fatigued.  Learn strategies to manage high temperatures. If the weather is extremely hot:  Avoid vigorous physical activity.  Use air conditioning or fans or seek a cooler location.  Avoid caffeine and alcohol.  Wear loose-fitting, lightweight, and light-colored clothing.  Learn strategies to manage cold temperatures. If the weather is extremely cold:  Avoid vigorous physical activity.  Layer clothes.  Wear mittens or gloves, a hat, and a scarf when going outside.  Avoid alcohol.  Obtain ongoing education and support as needed.  Participate in or seek rehabilitation as needed to maintain or improve independence and quality of life. SEEK MEDICAL CARE IF:   Your  weight increases by 03 lb/1.4 kg in 1 day or 05 lb/2.3 kg in a week.  You have increasing shortness of breath that is unusual for you.  You are unable to participate in your usual physical activities.  You tire easily.  You cough more than normal, especially with physical activity.  You have any or more swelling in areas such as your hands, feet, ankles, or abdomen.  You are unable to sleep because it is hard to breathe.  You feel like your heart is beating fast (palpitations).  You become dizzy or light-headed upon standing up. SEEK IMMEDIATE MEDICAL CARE IF:   You have difficulty breathing.  There is a change in mental status  such as decreased alertness or difficulty with concentration.  You have a pain or discomfort in your chest.  You have an episode of fainting (syncope). MAKE SURE YOU:   Understand these instructions.  Will watch your condition.  Will get help right away if you are not doing well or get worse. Document Released: 01/14/2005 Document Revised: 05/31/2013 Document Reviewed: 02/14/2012 [facility] Patient Information 2015 [place], [place]. This information is not intended to replace advice given to you by your health care provider. Make sure you discuss any questions you have with your health care provider.

## 2014-10-17 ENCOUNTER — Ambulatory Visit: Payer: Self-pay | Admitting: Medical

## 2014-10-25 ENCOUNTER — Ambulatory Visit: Payer: Self-pay | Admitting: Family

## 2014-10-27 ENCOUNTER — Telehealth: Payer: Self-pay

## 2014-10-27 NOTE — Telephone Encounter (Signed)
HOPPER - Pt came by saying he needs a refill on the following medications:  Pravastatin; amlodipine besylate; furosemide; isosorbide; and carvedilol.  Says the furosemide and bp medicine are needed most.  He has been out of these since September 14.  I told him he would need an office visit because it has been over a year.  He states that last year he had insurance and doesn't now.  He cant afford a visit, even with the discount and has no way to pay.  Please advise.  [phone]

## 2014-10-27 NOTE — Telephone Encounter (Signed)
Dr. Alwyn Ren can we refill?

## 2014-10-30 NOTE — Telephone Encounter (Signed)
Sorry, but it has been too long.  Needs seen.   Janace Hoard MD

## 2014-10-31 NOTE — Telephone Encounter (Signed)
Left voicemail advising to pt he needs an OV for furthur refills.

## 2014-11-17 ENCOUNTER — Inpatient Hospital Stay (HOSPITAL_COMMUNITY)
Admission: EM | Admit: 2014-11-17 | Discharge: 2014-11-20 | DRG: 292 | Disposition: A | Discharge status: Home Health | Payer: Self-pay | Attending: Internal Medicine | Admitting: Internal Medicine

## 2014-11-17 ENCOUNTER — Emergency Department (HOSPITAL_COMMUNITY): Payer: Self-pay

## 2014-11-17 ENCOUNTER — Encounter (HOSPITAL_COMMUNITY): Payer: Self-pay | Admitting: Emergency Medicine

## 2014-11-17 DIAGNOSIS — N179 Acute kidney failure, unspecified: Secondary | ICD-10-CM | POA: Diagnosis present

## 2014-11-17 DIAGNOSIS — I509 Heart failure, unspecified: Secondary | ICD-10-CM

## 2014-11-17 DIAGNOSIS — I5031 Acute diastolic (congestive) heart failure: Secondary | ICD-10-CM

## 2014-11-17 DIAGNOSIS — N183 Chronic kidney disease, stage 3 unspecified: Secondary | ICD-10-CM | POA: Diagnosis present

## 2014-11-17 DIAGNOSIS — N189 Chronic kidney disease, unspecified: Secondary | ICD-10-CM

## 2014-11-17 DIAGNOSIS — M1000 Idiopathic gout, unspecified site: Secondary | ICD-10-CM | POA: Diagnosis present

## 2014-11-17 DIAGNOSIS — R52 Pain, unspecified: Secondary | ICD-10-CM

## 2014-11-17 DIAGNOSIS — I272 Other secondary pulmonary hypertension: Secondary | ICD-10-CM | POA: Diagnosis present

## 2014-11-17 DIAGNOSIS — I160 Hypertensive urgency: Secondary | ICD-10-CM | POA: Diagnosis present

## 2014-11-17 DIAGNOSIS — M25571 Pain in right ankle and joints of right foot: Secondary | ICD-10-CM | POA: Diagnosis present

## 2014-11-17 DIAGNOSIS — I10 Essential (primary) hypertension: Secondary | ICD-10-CM | POA: Diagnosis present

## 2014-11-17 DIAGNOSIS — R748 Abnormal levels of other serum enzymes: Secondary | ICD-10-CM | POA: Diagnosis present

## 2014-11-17 DIAGNOSIS — Z87891 Personal history of nicotine dependence: Secondary | ICD-10-CM

## 2014-11-17 DIAGNOSIS — Z791 Long term (current) use of non-steroidal anti-inflammatories (NSAID): Secondary | ICD-10-CM

## 2014-11-17 DIAGNOSIS — Z9114 Patient's other noncompliance with medication regimen: Secondary | ICD-10-CM

## 2014-11-17 DIAGNOSIS — I5043 Acute on chronic combined systolic (congestive) and diastolic (congestive) heart failure: Principal | ICD-10-CM | POA: Diagnosis present

## 2014-11-17 DIAGNOSIS — M109 Gout, unspecified: Secondary | ICD-10-CM | POA: Diagnosis present

## 2014-11-17 DIAGNOSIS — E785 Hyperlipidemia, unspecified: Secondary | ICD-10-CM | POA: Diagnosis present

## 2014-11-17 DIAGNOSIS — Z6829 Body mass index (BMI) 29.0-29.9, adult: Secondary | ICD-10-CM

## 2014-11-17 DIAGNOSIS — E663 Overweight: Secondary | ICD-10-CM | POA: Diagnosis present

## 2014-11-17 DIAGNOSIS — Z23 Encounter for immunization: Secondary | ICD-10-CM

## 2014-11-17 DIAGNOSIS — I429 Cardiomyopathy, unspecified: Secondary | ICD-10-CM | POA: Diagnosis present

## 2014-11-17 DIAGNOSIS — I129 Hypertensive chronic kidney disease with stage 1 through stage 4 chronic kidney disease, or unspecified chronic kidney disease: Secondary | ICD-10-CM | POA: Diagnosis present

## 2014-11-17 DIAGNOSIS — Z79899 Other long term (current) drug therapy: Secondary | ICD-10-CM

## 2014-11-17 HISTORY — DX: Acute kidney failure, unspecified: N17.9

## 2014-11-17 HISTORY — DX: Acute kidney failure, unspecified: N18.9

## 2014-11-17 LAB — BASIC METABOLIC PANEL
Anion gap: 10 (ref 5–15)
BUN: 33 mg/dL — ABNORMAL HIGH (ref 6–20)
CHLORIDE: 107 mmol/L (ref 101–111)
CO2: 24 mmol/L (ref 22–32)
Calcium: 9 mg/dL (ref 8.9–10.3)
Creatinine, Ser: 2.1 mg/dL — ABNORMAL HIGH (ref 0.61–1.24)
GFR calc non Af Amer: 33 mL/min — ABNORMAL LOW (ref >60)
GFR, EST AFRICAN AMERICAN: 38 mL/min — AB (ref >60)
Glucose, Bld: 114 mg/dL — ABNORMAL HIGH (ref 65–99)
POTASSIUM: 3.9 mmol/L (ref 3.5–5.1)
Sodium: 141 mmol/L (ref 135–145)

## 2014-11-17 LAB — CBC
HEMATOCRIT: 38.2 % — AB (ref 39.0–52.0)
Hemoglobin: 13.3 g/dL (ref 13.0–17.0)
MCH: 30.6 pg (ref 26.0–34.0)
MCHC: 34.8 g/dL (ref 30.0–36.0)
MCV: 88 fL (ref 78.0–100.0)
Platelets: 281 10*3/uL (ref 150–400)
RBC: 4.34 MIL/uL (ref 4.22–5.81)
RDW: 13.8 % (ref 11.5–15.5)
WBC: 5.8 10*3/uL (ref 4.0–10.5)

## 2014-11-17 LAB — BRAIN NATRIURETIC PEPTIDE: B Natriuretic Peptide: 2169.7 pg/mL — ABNORMAL HIGH (ref 0.0–100.0)

## 2014-11-17 LAB — I-STAT TROPONIN, ED: TROPONIN I, POC: 0.07 ng/mL (ref 0.00–0.08)

## 2014-11-17 MED ORDER — SODIUM CHLORIDE 0.9 % IJ SOLN
3.0000 mL | Freq: Two times a day (BID) | INTRAMUSCULAR | Status: DC
  Administered 2014-11-17 – 2014-11-20 (×5): 3 mL via INTRAVENOUS

## 2014-11-17 MED ORDER — CARVEDILOL 12.5 MG PO TABS
12.5000 mg | ORAL_TABLET | Freq: Two times a day (BID) | ORAL | Status: DC
  Administered 2014-11-17 – 2014-11-20 (×6): 12.5 mg via ORAL
  Filled 2014-11-17 (×6): qty 1

## 2014-11-17 MED ORDER — ASPIRIN 81 MG PO CHEW
324.0000 mg | CHEWABLE_TABLET | Freq: Once | ORAL | Status: AC
  Administered 2014-11-17: 324 mg via ORAL
  Filled 2014-11-17: qty 4

## 2014-11-17 MED ORDER — ONDANSETRON HCL 4 MG/2ML IJ SOLN: 4.0000 mg | Freq: Four times a day (QID) | INTRAMUSCULAR | Status: DC | PRN

## 2014-11-17 MED ORDER — ZOLPIDEM TARTRATE 5 MG PO TABS
5.0000 mg | ORAL_TABLET | Freq: Every evening | ORAL | Status: DC | PRN
  Filled 2014-11-17: qty 1

## 2014-11-17 MED ORDER — FUROSEMIDE 10 MG/ML IJ SOLN
40.0000 mg | Freq: Every day | INTRAMUSCULAR | Status: DC
  Administered 2014-11-18 – 2014-11-19 (×2): 40 mg via INTRAVENOUS
  Filled 2014-11-17 (×2): qty 4

## 2014-11-17 MED ORDER — SODIUM CHLORIDE 0.9 % IJ SOLN: 3.0000 mL | INTRAMUSCULAR | Status: DC | PRN

## 2014-11-17 MED ORDER — PRAVASTATIN SODIUM 20 MG PO TABS
20.0000 mg | ORAL_TABLET | Freq: Every day | ORAL | Status: DC
  Administered 2014-11-17 – 2014-11-20 (×4): 20 mg via ORAL
  Filled 2014-11-17 (×4): qty 1

## 2014-11-17 MED ORDER — FUROSEMIDE 10 MG/ML IJ SOLN
60.0000 mg | Freq: Once | INTRAMUSCULAR | Status: AC
  Administered 2014-11-17: 60 mg via INTRAVENOUS
  Filled 2014-11-17: qty 8

## 2014-11-17 MED ORDER — ACETAMINOPHEN 325 MG PO TABS: 650.0000 mg | ORAL_TABLET | ORAL | Status: DC | PRN

## 2014-11-17 MED ORDER — ASPIRIN EC 325 MG PO TBEC
325.0000 mg | DELAYED_RELEASE_TABLET | Freq: Every day | ORAL | Status: DC
  Administered 2014-11-18 – 2014-11-20 (×3): 325 mg via ORAL
  Filled 2014-11-17 (×3): qty 1

## 2014-11-17 MED ORDER — ALBUTEROL SULFATE (2.5 MG/3ML) 0.083% IN NEBU
5.0000 mg | INHALATION_SOLUTION | Freq: Once | RESPIRATORY_TRACT | Status: AC
  Administered 2014-11-17: 5 mg via RESPIRATORY_TRACT
  Filled 2014-11-17: qty 6

## 2014-11-17 MED ORDER — NITROGLYCERIN IN D5W 200-5 MCG/ML-% IV SOLN
0.0000 ug/min | Freq: Once | INTRAVENOUS | Status: AC
  Administered 2014-11-17: 5 ug/min via INTRAVENOUS
  Filled 2014-11-17: qty 250

## 2014-11-17 MED ORDER — SODIUM CHLORIDE 0.9 % IV SOLN: 250.0000 mL | INTRAVENOUS | Status: DC | PRN

## 2014-11-17 MED ORDER — NITROGLYCERIN IN D5W 200-5 MCG/ML-% IV SOLN
0.0000 ug/min | INTRAVENOUS | Status: DC
  Administered 2014-11-17: 55 ug/min via INTRAVENOUS

## 2014-11-17 MED ORDER — HEPARIN SODIUM (PORCINE) 5000 UNIT/ML IJ SOLN
5000.0000 [IU] | Freq: Three times a day (TID) | INTRAMUSCULAR | Status: DC
  Administered 2014-11-18 – 2014-11-20 (×6): 5000 [IU] via SUBCUTANEOUS
  Filled 2014-11-17 (×8): qty 1

## 2014-11-17 MED ORDER — AMLODIPINE BESYLATE 5 MG PO TABS
5.0000 mg | ORAL_TABLET | Freq: Every day | ORAL | Status: DC
  Administered 2014-11-17: 5 mg via ORAL
  Filled 2014-11-17: qty 1

## 2014-11-17 MED ORDER — ISOSORBIDE MONONITRATE ER 30 MG PO TB24
30.0000 mg | ORAL_TABLET | Freq: Every day | ORAL | Status: DC
  Administered 2014-11-17: 30 mg via ORAL
  Filled 2014-11-17: qty 1

## 2014-11-17 NOTE — ED Notes (Signed)
Per pt, states he has been out of meds for a month-states elevated BP causing him to have SOB

## 2014-11-17 NOTE — ED Notes (Signed)
Food given to patient.  

## 2014-11-17 NOTE — ED Provider Notes (Signed)
CSN: [id number]     Arrival date & time 11/17/14  1518 History   First MD Initiated Contact with Patient 11/17/14 1629     Chief Complaint  Patient presents with  . Hypertension     (Consider location/radiation/quality/duration/timing/severity/associated sxs/prior Treatment) HPI Comments: 59 y.o. Male with history of HTN, CHF, CKD, hyperlipidemia presents for HTN and shortness of breath.  The patient reports that since September 19th he has been out of his medications and has not been able to afford them because he lost his job and got a new one and that he wont have insurance until January.  He says that he has been having progressive shortness of breath and that he has noted that he has been swelling in his arms and legs that seems to come and go somewhat.  He now can no longer walk across a room without becoming extremely winded.  He denies chest pain.  No cough or fever.  No history of smoking.  Patient is a 59 y.o. male presenting with hypertension.  Hypertension Associated symptoms include shortness of breath. Pertinent negatives include no chest pain, no abdominal pain and no headaches.    Past Medical History  Diagnosis Date  . ED (erectile dysfunction)   . HTN (hypertension)   . Chronic systolic CHF (congestive heart failure) (HCC)     EF 30% in the past => improved to normal;  echo 6/13:  Mod LVH, EF 55%, Gr 1 DD, mild LAE, normal RV sized and fxn  . Chronic kidney disease   . Hyperlipidemia   . Cardiomyopathy (HCC)     likley HTN;  EF 30% => improved to normal (echo in 2012 and 2013 with normal LVF);  myoview 2004: inf defect likely diaph atten, no ischemia   History reviewed. No pertinent past surgical history. No family history on file. Social History  Substance Use Topics  . Smoking status: Former Games developer  . Smokeless tobacco: None     Comment: PATIENT QUITE ABOUT A YEAR NOW  . Alcohol Use: 1.8 oz/week    3 Cans of beer per week     Comment: daily    Review of  Systems  Constitutional: Negative for fever, chills, diaphoresis and fatigue.  HENT: Negative for congestion, postnasal drip and rhinorrhea.   Eyes: Negative for pain and redness.  Respiratory: Positive for shortness of breath. Negative for cough and chest tightness.   Cardiovascular: Positive for leg swelling. Negative for chest pain and palpitations.  Gastrointestinal: Negative for nausea, vomiting, abdominal pain, diarrhea and constipation.  Genitourinary: Negative for dysuria and urgency.  Musculoskeletal: Negative for myalgias, back pain and neck pain.  Skin: Negative for rash.  Neurological: Negative for dizziness, weakness, light-headedness and headaches.  Hematological: Does not bruise/bleed easily.      Allergies  Review of patient's allergies indicates no known allergies.  Home Medications   Prior to Admission medications   Medication Sig Start Date End Date Taking? Authorizing Provider  naproxen sodium (ANAPROX) 220 MG tablet Take 220 mg by mouth 2 (two) times daily as needed (pain).   Yes Historical Provider, MD  amLODipine (NORVASC) 5 MG tablet Take 1 tablet (5 mg total) by mouth daily. 09/16/14   Rolland Porter, MD  carvedilol (COREG) 12.5 MG tablet Take 1 tablet (12.5 mg total) by mouth 2 (two) times daily with a meal. 09/16/14   Rolland Porter, MD  furosemide (LASIX) 40 MG tablet Take 1 tablet (40 mg total) by mouth daily. 09/16/14   Loraine Leriche  Fayrene Fearing, MD  isosorbide mononitrate (IMDUR) 30 MG 24 hr tablet Take 1 tablet (30 mg total) by mouth daily. 09/16/14   Rolland Porter, MD  pravastatin (PRAVACHOL) 20 MG tablet Take 1 tablet (20 mg total) by mouth daily. 09/16/14   Rolland Porter, MD   BP 128/91 mmHg  Pulse 84  Temp(Src) 98.5 F (36.9 C) (Oral)  Resp 19  Ht  (1.702 m)  Wt 186 lb 8.2 oz (84.6 kg)  BMI 29.20 kg/m2  SpO2 90% Physical Exam  Constitutional: He is oriented to person, place, and time. He appears well-developed and well-nourished. No distress.  HENT:  Head:  Normocephalic and atraumatic.  Right Ear: External ear normal.  Left Ear: External ear normal.  Mouth/Throat: Oropharynx is clear and moist. No oropharyngeal exudate.  Eyes: EOM are normal. Pupils are equal, round, and reactive to light.  Neck: Normal range of motion. Neck supple.  Cardiovascular: Normal rate, regular rhythm, normal heart sounds and intact distal pulses.   No murmur heard. Pulmonary/Chest: Effort normal. No respiratory distress. He has no wheezes. He has rales (few, scattered, bilateral).  Abdominal: Soft. He exhibits no distension. There is no tenderness.  Musculoskeletal: He exhibits edema (1+ pitting edema of the bilateral lower extremities).  Neurological: He is alert and oriented to person, place, and time.  Skin: Skin is warm and dry. No rash noted. He is not diaphoretic.  Vitals reviewed.   ED Course  Procedures (including critical care time) Labs Review Labs Reviewed  BASIC METABOLIC PANEL - Abnormal; Notable for the following:    Glucose, Bld 114 (*)    BUN 33 (*)    Creatinine, Ser 2.10 (*)    GFR calc non Af Amer 33 (*)    GFR calc Af Amer 38 (*)    All other components within normal limits  CBC - Abnormal; Notable for the following:    HCT 38.2 (*)    All other components within normal limits  BRAIN NATRIURETIC PEPTIDE - Abnormal; Notable for the following:    B Natriuretic Peptide 2169.7 (*)    All other components within normal limits  PROTIME-INR - Abnormal; Notable for the following:    Prothrombin Time 15.6 (*)    All other components within normal limits  TROPONIN I - Abnormal; Notable for the following:    Troponin I 0.07 (*)    All other components within normal limits  APTT  TSH  BASIC METABOLIC PANEL  TROPONIN I  TROPONIN I  I-STAT TROPOININ, ED    Imaging Review Dg Chest 2 View  11/17/2014  CLINICAL DATA:  Shortness of breath for 2-3 weeks, hypertension, former smoker, chronic systolic CHF, cardiomyopathy, chronic kidney  disease, hyperlipidemia EXAM: CHEST  2 VIEW COMPARISON:  02/05/2014 FINDINGS: Enlargement of cardiac silhouette with pulmonary vascular congestion. Tortuous aorta. Decreased accentuation of interstitial markings compatible with improved failure. No segmental consolidation, pleural effusion or pneumothorax. Bones unremarkable. IMPRESSION: Enlargement of cardiac silhouette with pulmonary vascular congestion. Improved pulmonary edema since previous exam. Electronically Signed   By: Ulyses Southward M.D.   On: 11/17/2014 18:12   I have personally reviewed and evaluated these images and lab results as part of my medical decision-making.   EKG Interpretation   Date/Time:  Thursday November 17 2014 17:13:11 EDT Ventricular Rate:  87 PR Interval:  180 QRS Duration: 91 QT Interval:  426 QTC Calculation: 512 R Axis:   -73 Text Interpretation:  Sinus rhythm Ventricular premature complex Left  atrial enlargement Left  anterior fascicular block Nonspecific T  abnormalities, lateral leads No significant change since last tracing  Confirmed by Mckinzee Spirito ([id number]) on 11/17/2014 5:23:04 PM      MDM  Patient seen and evaluated in stable condition.  Presentation consistent with CHF.  Given Lasix and Imdur with some improvement but continued to have increased work of breathing and tachypnea.  Patient started on IV Nitro with significant improvement in breathing.  Discussed case with Dr. Liane Comber who agreed with admission and patient was admitted under his care. Final diagnoses:  CHF (congestive heart failure) (HCC)    1. CHF exacerbation    Leta Baptist, MD 11/18/14 [phone]

## 2014-11-17 NOTE — ED Notes (Signed)
Patient states he is having no issues with Nitroglycerin drip.  No SOB or chest pain

## 2014-11-17 NOTE — Progress Notes (Addendum)
CM consult for medication assist CM noted a note in EPIC stating pt went by Dr Quintella Reichert office requesting  Prescription refills for pravastatin, lasix, coreg, isosorbide and amlopidine  Cm provided pt with copies of cost lists for these medications and discount cards for a month supply of each medication CM reviewed use of goodrx.com, provided uninsured resources to include list of [place]  [employer] but pt states he prefers to stay with Dr Alwyn Ren  Pt reports Dr Alwyn Ren provides Prescriptions for 6 month - one year of medications.   Pt stated he was unable to see Dr hopper today related his direct deposit errors.  He states he would like a month worth of medication from EDP until he can get to see Dr Alwyn Ren Pt states came to ED related to sob today   ED RN in room with ED CM during CM interaction with him CM attempted to updated EDP Left note for EDP to state pt agrees he can pay $29.86 plus tax for medications on 11/18/14 and want rxs for 30 day supply on above listed medications

## 2014-11-17 NOTE — H&P (Signed)
[facility] History and Physical  Gergory Biello ZOX:[id number] DOB: 01-15-1956 DOA: 11/17/2014  Referring physician: Tyrone Apple, MD PCP: Janace Hoard, MD   Chief Complaint: Accelerated Hypertension  HPI: Jeffery Hodges is a 59 y.o. male with history of hyperlipidemia HTN CKD CHF presented with increased blood pressure. Patient states that he has had HTN since about 2000. He has normally been taking his medications but he states he lost his job back in August. He has not been taking his medications since about September 19th. He states he has noted increased shortness of breath and he has also noted swelling of his legs and pain in his feet. He states that he has had no headaches noted. No chest pain noted but he states he feels a little strange from his central chest to his neck, No syncope noted. He has no fevers noted. Patient came into the ED and had been noted to have an elevated pressure. He was initially managed with given diuretics but his breathing did not improve and was therefore started on a NTG drip. Patient states that now he does feel better. He has had good response to the diuretics also. He states that he does not smoke or drink and denies any drug use.    Review of Systems:  Constitutional:  +weight gain due to fluid retention, night sweats, Fevers, chills, +fatigue.  HEENT:  No headaches, itching, ear ache, nasal congestion, post nasal drip,  Cardio-vascular:  No chest pain, +swelling in lower extremities, anasarca, dizziness, palpitations  GI:  No heartburn,  abdominal pain, nausea, vomiting, diarrhea  Resp:  +shortness of breath. +cough, No coughing up of blood Skin:  no rash or lesions GU:  no dysuria, change in color of urine.  Musculoskeletal:  +joint pain. No decreased range of motion. No back pain.  Psych:  No change in mood or affect. No depression or anxiety. No memory loss.   Past Medical History  Diagnosis Date  . ED (erectile dysfunction)   . HTN  (hypertension)   . Chronic systolic CHF (congestive heart failure) (HCC)     EF 30% in the past => improved to normal;  echo 6/13:  Mod LVH, EF 55%, Gr 1 DD, mild LAE, normal RV sized and fxn  . Chronic kidney disease   . Hyperlipidemia   . Cardiomyopathy (HCC)     likley HTN;  EF 30% => improved to normal (echo in 2012 and 2013 with normal LVF);  myoview 2004: inf defect likely diaph atten, no ischemia   History reviewed. No pertinent past surgical history. Social History:  reports that he has quit smoking. He does not have any smokeless tobacco history on file. He reports that he drinks about 1.8 oz of alcohol per week. He reports that he does not use illicit drugs.  No Known Allergies  No family history on file.   Prior to Admission medications   Medication Sig Start Date End Date Taking? Authorizing Provider  naproxen sodium (ANAPROX) 220 MG tablet Take 220 mg by mouth 2 (two) times daily as needed (pain).   Yes Historical Provider, MD  amLODipine (NORVASC) 5 MG tablet Take 1 tablet (5 mg total) by mouth daily. 09/16/14   Rolland Porter, MD  carvedilol (COREG) 12.5 MG tablet Take 1 tablet (12.5 mg total) by mouth 2 (two) times daily with a meal. 09/16/14   Rolland Porter, MD  furosemide (LASIX) 40 MG tablet Take 1 tablet (40 mg total) by mouth daily. 09/16/14   Rolland Porter, MD  isosorbide mononitrate (IMDUR) 30 MG 24 hr tablet Take 1 tablet (30 mg total) by mouth daily. 09/16/14   Rolland Porter, MD  pravastatin (PRAVACHOL) 20 MG tablet Take 1 tablet (20 mg total) by mouth daily. 09/16/14   Rolland Porter, MD   Physical Exam: Filed Vitals:   11/17/14 2045 11/17/14 2100 11/17/14 2123 11/17/14 2154  BP: 148/114 146/107 134/95 142/104  Pulse: 74  77 79  Temp:      TempSrc:      Resp: SpO2: 100%  94%     Wt Readings from Last 3 Encounters:  09/16/14 83.915 kg (185 lb)  02/08/14 87.363 kg (192 lb 9.6 oz)  04/29/13 92.08 kg (203 lb)    General:  Appears calm and comfortable Eyes:  PERRL, normal lids, irises & conjunctiva ENT: grossly normal hearing, lips & tongue Neck: no LAD, masses or thyromegaly Cardiovascular: RRR, no rubs. + LE edema. Telemetry: SR, no arrhythmias  Respiratory: few basal crackles, no rhonchi Abdomen: soft, ntnd Skin: no rash or induration seen on limited exam Musculoskeletal: grossly normal tone BUE/BLE Psychiatric: grossly normal mood and affect Neurologic: grossly non-focal.          Labs on Admission:  Basic Metabolic Panel:  Recent Labs Lab 11/17/14 1553  NA 141  K 3.9  CL 107  CO2 24  GLUCOSE 114*  BUN 33*  CREATININE 2.10*  CALCIUM 9.0   Liver Function Tests: No results for input(s): AST, ALT, ALKPHOS, BILITOT, PROT, ALBUMIN in the last 168 hours. No results for input(s): LIPASE, AMYLASE in the last 168 hours. No results for input(s): AMMONIA in the last 168 hours. CBC:  Recent Labs Lab 11/17/14 1553  WBC 5.8  HGB 13.3  HCT 38.2*  MCV 88.0  PLT 281   Cardiac Enzymes: No results for input(s): CKTOTAL, CKMB, CKMBINDEX, TROPONINI in the last 168 hours.  BNP (last 3 results)  Recent Labs  02/05/14 0700 11/17/14 1555  BNP 531.4* 2169.7*    ProBNP (last 3 results) No results for input(s): PROBNP in the last 8760 hours.  CBG: No results for input(s): GLUCAP in the last 168 hours.  Radiological Exams on Admission: Dg Chest 2 View  11/17/2014  CLINICAL DATA:  Shortness of breath for 2-3 weeks, hypertension, former smoker, chronic systolic CHF, cardiomyopathy, chronic kidney disease, hyperlipidemia EXAM: CHEST  2 VIEW COMPARISON:  02/05/2014 FINDINGS: Enlargement of cardiac silhouette with pulmonary vascular congestion. Tortuous aorta. Decreased accentuation of interstitial markings compatible with improved failure. No segmental consolidation, pleural effusion or pneumothorax. Bones unremarkable. IMPRESSION: Enlargement of cardiac silhouette with pulmonary vascular congestion. Improved pulmonary edema since  previous exam. Electronically Signed   By: Ulyses Southward M.D.   On: 11/17/2014 18:12      EKG: Independently reviewed.   Assessment/Plan Active Problems:   HTN (hypertension)   Acute diastolic CHF (congestive heart failure) (HCC)   Acute on chronic kidney failure (HCC)   1. Acute Diastolic Heart failure -patient will be admitted to step down unit as right now he is requiring a nitro drip and start CHF pathway -will continue with nitro drip -will continue with lasix IV -resume coreg and amlodipine -hold imdur since right now is on NTG drip -f/u CXR in am -will get an Echo in am -control his blood pressure -will also monitor serial enzymes  2. Accelerated HTN -will titrate nitro as needed -monitor pressures and resume his medications as he has not been taking the medications  3. Acute  on Chronic Kidney failure -there is a slight bump in the creatinine (minimal) -monitor labs and repeat in am  4. Hyperlipidemia -will resume pravachol     Code Status: full code (must indicate code status--if unknown or must be presumed, indicate so) DVT Prophylaxis:heparin Family Communication: none (indicate person spoken with, if applicable, with phone number if by telephone) Disposition Plan: home (indicate anticipated LOS)    [facility] A [facility] Pager [phone]

## 2014-11-18 ENCOUNTER — Inpatient Hospital Stay (HOSPITAL_COMMUNITY): Payer: Self-pay

## 2014-11-18 ENCOUNTER — Inpatient Hospital Stay (HOSPITAL_COMMUNITY): Payer: MEDICAID

## 2014-11-18 DIAGNOSIS — I509 Heart failure, unspecified: Secondary | ICD-10-CM

## 2014-11-18 LAB — TSH: TSH: 1.307 u[IU]/mL (ref 0.350–4.500)

## 2014-11-18 LAB — TROPONIN I
TROPONIN I: 0.04 ng/mL — AB (ref <0.031)
TROPONIN I: 0.04 ng/mL — AB (ref <0.031)
TROPONIN I: 0.07 ng/mL — AB (ref <0.031)

## 2014-11-18 LAB — BASIC METABOLIC PANEL
ANION GAP: 10 (ref 5–15)
BUN: 33 mg/dL — ABNORMAL HIGH (ref 6–20)
CO2: 27 mmol/L (ref 22–32)
Calcium: 8.6 mg/dL — ABNORMAL LOW (ref 8.9–10.3)
Chloride: 102 mmol/L (ref 101–111)
Creatinine, Ser: 2.2 mg/dL — ABNORMAL HIGH (ref 0.61–1.24)
GFR calc Af Amer: 36 mL/min — ABNORMAL LOW (ref >60)
GFR calc non Af Amer: 31 mL/min — ABNORMAL LOW (ref >60)
GLUCOSE: 113 mg/dL — AB (ref 65–99)
POTASSIUM: 3.4 mmol/L — AB (ref 3.5–5.1)
Sodium: 139 mmol/L (ref 135–145)

## 2014-11-18 LAB — URIC ACID: Uric Acid, Serum: 10 mg/dL — ABNORMAL HIGH (ref 4.4–7.6)

## 2014-11-18 LAB — APTT: aPTT: 29 seconds (ref 24–37)

## 2014-11-18 LAB — PROTIME-INR
INR: 1.22 (ref 0.00–1.49)
Prothrombin Time: 15.6 seconds — ABNORMAL HIGH (ref 11.6–15.2)

## 2014-11-18 LAB — MRSA PCR SCREENING: MRSA by PCR: NEGATIVE

## 2014-11-18 MED ORDER — POTASSIUM CHLORIDE CRYS ER 20 MEQ PO TBCR
20.0000 meq | EXTENDED_RELEASE_TABLET | Freq: Once | ORAL | Status: AC
Start: 2014-11-18 — End: 2014-11-18
  Administered 2014-11-18: 20 meq via ORAL
  Filled 2014-11-18: qty 1

## 2014-11-18 MED ORDER — ISOSORBIDE MONONITRATE ER 30 MG PO TB24
30.0000 mg | ORAL_TABLET | Freq: Every day | ORAL | Status: DC
  Administered 2014-11-18 – 2014-11-20 (×3): 30 mg via ORAL
  Filled 2014-11-18 (×3): qty 1

## 2014-11-18 MED ORDER — PREDNISONE 50 MG PO TABS
60.0000 mg | ORAL_TABLET | Freq: Every day | ORAL | Status: DC
  Administered 2014-11-18 – 2014-11-20 (×3): 60 mg via ORAL
  Filled 2014-11-18 (×3): qty 1

## 2014-11-18 MED ORDER — HYDROCODONE-ACETAMINOPHEN 5-325 MG PO TABS
1.0000 | ORAL_TABLET | Freq: Four times a day (QID) | ORAL | Status: DC | PRN
  Administered 2014-11-18 – 2014-11-20 (×3): 1 via ORAL
  Filled 2014-11-18 (×3): qty 1

## 2014-11-18 NOTE — Care Management Note (Signed)
Case Management Note  Patient Details  Name: Jeffery Hodges MRN: [id number] Date of Birth: 1955-02-07  Subjective/Objective:     Hypertensive crisis and iv ntg drip for control               Action/Plan:  Will follow for needs   Expected Discharge Date:                  Expected Discharge Plan:  Home/Self Care  In-House Referral:  NA  Discharge planning Services  CM Consult  Post Acute Care Choice:  NA Choice offered to:  NA  DME Arranged:    DME Agency:     HH Arranged:    HH Agency:     Status of Service:  In process, will continue to follow  Medicare Important Message Given:    Date Medicare IM Given:    Medicare IM give by:    Date Additional Medicare IM Given:    Additional Medicare Important Message give by:     If discussed at Long Length of Stay Meetings, dates discussed:    Additional Comments:  Golda Acre, RN 11/18/2014, 11:34 AM

## 2014-11-18 NOTE — Progress Notes (Signed)
[id number]/BRAXEN Bexlee Bergdoll,RN,BSN,CCM: tct-K[facility] with [facility]-aware of need for heart failre program.

## 2014-11-18 NOTE — Care Management Note (Signed)
Case Management Note  Patient Details  Name: Jeffery Hodges MRN: [id number] Date of Birth: September 24, 1955  Subjective/Objective:    See ED CM note-she has already provided resources, & confirmed patient able to pay for meds, & he wants his pcp-Dr. Alwyn Ren.has pharmacy.No further d/c needs.                Action/Plan:d/c plan home.   Expected Discharge Date:                  Expected Discharge Plan:  Home w Home Health Services  In-House Referral:  NA  Discharge planning Services  CM Consult  Post Acute Care Choice:  NA, Home Health Choice offered to:  NA  DME Arranged:    DME Agency:     HH Arranged:  Disease Management HH Agency:  [facility]  Status of Service:  In process, will continue to follow  Medicare Important Message Given:    Date Medicare IM Given:    Medicare IM give by:    Date Additional Medicare IM Given:    Additional Medicare Important Message give by:     If discussed at Long Length of Stay Meetings, dates discussed:    Additional Comments:  Lanier Clam, RN 11/18/2014, 1:27 PM

## 2014-11-18 NOTE — Progress Notes (Signed)
[facility] PROGRESS NOTE  Treyten Monestime ZOX:[id number] DOB: 09-02-1955 DOA: 11/17/2014 PCP: Janace Hoard, MD  Assessment/Plan: Jeffery Hodges is a 59 y.o. male with history of hyperlipidemia,  HTN,  CKD,  CHF presented with increased blood pressure. He was also complaining of chest pressure, and dyspnea. He was started on nitroglycerin Gtt with improvement of symptoms.   1-Acute Diastolic Heart failure In setting of not able to afford medications.  Check ECHO.  IV lasix. Daily weight. Weight 84 Kg.  Continue with coreg, resume Imdur.  Mildly elevated troponin in setting of HF. Follow ECHO.   2-Accelerated HTN; SBP better controlled. Now in the 100 range. Nitroglycerin Gtt discontinue. IV lasix, coreg. Hold Norvasc this am. If BP increases will resume Norvasc.   3-Acute on Chronic Kidney failure stage III Cr per records 1.6 to 2.0.  monitor on lasix.  Good urine out put: 1.2 L.   4-Hyperlipidemia; continue with statins.   5-Right ankle pain: check x ray, uric acid level. Might need prednisone if gout. vicodin for pain ordered.   Code Status: Full Code.  Family Communication: care discussed with patient.  Disposition Plan:   Consultants:  none  Procedures: ECHO: pending.   Antibiotics:  none  HPI/Subjective: He is feeling better, breathing better.  He is complaining of right ankle pain.   Objective: Filed Vitals:   11/18/14 0500  BP: 114/69  Pulse: 75  Temp:   Resp: 20    Intake/Output Summary (Last 24 hours) at 11/18/14 0737 Last data filed at 11/18/14 0600  Gross per 24 hour  Intake 107.41 ml  Output   1225 ml  Net -1117.59 ml   Filed Weights   11/17/14 2300 11/18/14 0105  Weight: 84.6 kg (186 lb 8.2 oz) 84.6 kg (186 lb 8.2 oz)    Exam:   General:  Alert in no distress  Cardiovascular: S 1, S 2 RRR  Respiratory: CTA  Abdomen: Bs present, soft, nt  Musculoskeletal: right ankle with swelling, no redness, tender to palpation.   Data  Reviewed: Basic Metabolic Panel:  Recent Labs Lab 11/17/14 1553 11/18/14 0550  NA 141 139  K 3.9 3.4*  CL 107 102  CO2 24 27  GLUCOSE 114* 113*  BUN 33* 33*  CREATININE 2.10* 2.20*  CALCIUM 9.0 8.6*   Liver Function Tests: No results for input(s): AST, ALT, ALKPHOS, BILITOT, PROT, ALBUMIN in the last 168 hours. No results for input(s): LIPASE, AMYLASE in the last 168 hours. No results for input(s): AMMONIA in the last 168 hours. CBC:  Recent Labs Lab 11/17/14 1553  WBC 5.8  HGB 13.3  HCT 38.2*  MCV 88.0  PLT 281   Cardiac Enzymes:  Recent Labs Lab 11/17/14 2355 11/18/14 0550  TROPONINI 0.07* 0.04*   BNP (last 3 results)  Recent Labs  02/05/14 0700 11/17/14 1555  BNP 531.4* 2169.7*    ProBNP (last 3 results) No results for input(s): PROBNP in the last 8760 hours.  CBG: No results for input(s): GLUCAP in the last 168 hours.  Recent Results (from the past 240 hour(s))  MRSA PCR Screening     Status: None   Collection Time: 11/18/14  3:19 AM  Result Value Ref Range Status   MRSA by PCR NEGATIVE NEGATIVE Final    Comment:        The GeneXpert MRSA Assay (FDA approved for NASAL specimens only), is one component of a comprehensive MRSA colonization surveillance program. It is not intended to diagnose MRSA infection nor to  guide or monitor treatment for MRSA infections.      Studies: Dg Chest 2 View  11/17/2014  CLINICAL DATA:  Shortness of breath for 2-3 weeks, hypertension, former smoker, chronic systolic CHF, cardiomyopathy, chronic kidney disease, hyperlipidemia EXAM: CHEST  2 VIEW COMPARISON:  02/05/2014 FINDINGS: Enlargement of cardiac silhouette with pulmonary vascular congestion. Tortuous aorta. Decreased accentuation of interstitial markings compatible with improved failure. No segmental consolidation, pleural effusion or pneumothorax. Bones unremarkable. IMPRESSION: Enlargement of cardiac silhouette with pulmonary vascular congestion.  Improved pulmonary edema since previous exam. Electronically Signed   By: Ulyses Southward M.D.   On: 11/17/2014 18:12    Scheduled Meds: . amLODipine  5 mg Oral Daily  . aspirin EC  325 mg Oral Daily  . carvedilol  12.5 mg Oral BID WC  . furosemide  40 mg Intravenous Daily  . heparin  5,000 Units Subcutaneous 3 times per day  . pravastatin  20 mg Oral Daily  . sodium chloride  3 mL Intravenous Q12H   Continuous Infusions: . nitroGLYCERIN Stopped (11/18/14 0520)    Active Problems:   HTN (hypertension)   Acute diastolic CHF (congestive heart failure) (HCC)   Acute on chronic kidney failure (HCC)   Acute diastolic heart failure (HCC)    Time spent: 35 minutes.     Hartley Barefoot A  [facility] Pager [phone]. If 7PM-7AM, please contact night-coverage at [email or web address], password [facility] 11/18/2014, 7:37 AM  LOS: 1 day

## 2014-11-18 NOTE — Care Management Note (Signed)
Case Management Note  Patient Details  Name: Jeffery Hodges MRN: [id number] Date of Birth: [birth year]  Subjective/Objective:  Transfer from SDU. From home.[facility] already following for CHF protocal.[facility] rep Kristen aware.Await HHRN-disease mgmnt order, face to face.                  Action/Plan:d/c home w/HHC.   Expected Discharge Date:                  Expected Discharge Plan:  Home w Home Health Services  In-House Referral:  NA  Discharge planning Services  CM Consult  Post Acute Care Choice:  NA, Home Health Choice offered to:  NA  DME Arranged:    DME Agency:     HH Arranged:  Disease Management HH Agency:  [facility]  Status of Service:  In process, will continue to follow  Medicare Important Message Given:    Date Medicare IM Given:    Medicare IM give by:    Date Additional Medicare IM Given:    Additional Medicare Important Message give by:     If discussed at Long Length of Stay Meetings, dates discussed:    Additional Comments:  Lanier Clam, RN 11/18/2014, 12:08 PM

## 2014-11-18 NOTE — Progress Notes (Signed)
Echocardiogram 2D Echocardiogram has been performed.  Aarish Rockers 11/18/2014, 11:05 AM

## 2014-11-19 DIAGNOSIS — I5043 Acute on chronic combined systolic (congestive) and diastolic (congestive) heart failure: Principal | ICD-10-CM

## 2014-11-19 DIAGNOSIS — I10 Essential (primary) hypertension: Secondary | ICD-10-CM

## 2014-11-19 DIAGNOSIS — N179 Acute kidney failure, unspecified: Secondary | ICD-10-CM

## 2014-11-19 DIAGNOSIS — N189 Chronic kidney disease, unspecified: Secondary | ICD-10-CM

## 2014-11-19 DIAGNOSIS — E663 Overweight: Secondary | ICD-10-CM

## 2014-11-19 LAB — BASIC METABOLIC PANEL
Anion gap: 7 (ref 5–15)
BUN: 31 mg/dL — AB (ref 6–20)
CALCIUM: 8.9 mg/dL (ref 8.9–10.3)
CO2: 28 mmol/L (ref 22–32)
Chloride: 102 mmol/L (ref 101–111)
Creatinine, Ser: 1.9 mg/dL — ABNORMAL HIGH (ref 0.61–1.24)
GFR calc Af Amer: 43 mL/min — ABNORMAL LOW (ref >60)
GFR, EST NON AFRICAN AMERICAN: 37 mL/min — AB (ref >60)
GLUCOSE: 139 mg/dL — AB (ref 65–99)
Potassium: 4.6 mmol/L (ref 3.5–5.1)
Sodium: 137 mmol/L (ref 135–145)

## 2014-11-19 LAB — CBC
HEMATOCRIT: 37 % — AB (ref 39.0–52.0)
HEMOGLOBIN: 12.5 g/dL — AB (ref 13.0–17.0)
MCH: 29.9 pg (ref 26.0–34.0)
MCHC: 33.8 g/dL (ref 30.0–36.0)
MCV: 88.5 fL (ref 78.0–100.0)
Platelets: 298 10*3/uL (ref 150–400)
RBC: 4.18 MIL/uL — ABNORMAL LOW (ref 4.22–5.81)
RDW: 13.6 % (ref 11.5–15.5)
WBC: 5.9 10*3/uL (ref 4.0–10.5)

## 2014-11-19 MED ORDER — HYDRALAZINE HCL 10 MG PO TABS
10.0000 mg | ORAL_TABLET | Freq: Three times a day (TID) | ORAL | Status: DC
  Administered 2014-11-19 – 2014-11-20 (×3): 10 mg via ORAL
  Filled 2014-11-19 (×4): qty 1

## 2014-11-19 MED ORDER — FUROSEMIDE 10 MG/ML IJ SOLN
40.0000 mg | Freq: Two times a day (BID) | INTRAMUSCULAR | Status: DC
  Administered 2014-11-19 – 2014-11-20 (×2): 40 mg via INTRAVENOUS
  Filled 2014-11-19 (×2): qty 4

## 2014-11-19 NOTE — Progress Notes (Signed)
PROGRESS NOTE  Jeffery Hodges RWE:[id number] DOB: March 11, 1955 DOA: 11/17/2014 PCP: Janace Hoard, MD  HPI/Recap of past 84 hours: 59 year old male with past history of mild systolic/diastolic heart failure plus CK DN hypertension who lost his job several months ago and stopped taking his medications one month ago. He was admitted for congestive heart failure exacerbation on 10/20 after noting several days of increasing dyspnea on exertion. Initially patient was complaining of some mild chest pressure but with diuretics and nitroglycerin, he felt better. Enzymes remained stable. His diuresed over 2 L.  Today, patient states he's feeling okay. Breathing is much easier. No chest pain. Repeat echocardiogram noted worsening of ejection fraction and grade 3 diastolic dysfunction. Cardiology consulted who added hydralazine to patient's regimen and recommended continued diuresis.  Assessment/Plan: Principal Problem:   Acute on chronic combined systolic and diastolic heart failure (HCC): No ACE inhibitor secondary to renal dysfunction. Continue diuresis to dry weight Active Problems:   HTN (hypertension): Stable. As above   Acute on chronic kidney failure (HCC): Continues to improve   Code Status: Full code  Family Communication: Left message with family  Disposition Plan: Anticipate discharge in next few days once fully diuresed   Consultants:  Cardiology  Procedures:  Echocardiogram done 10/21: Grade 3 diastolic dysfunction, ejection fraction 35-40%  Antibiotics:  None   Objective: BP 131/91 mmHg  Pulse 71  Temp(Src) 97.9 F (36.6 C) (Oral)  Resp 16  Ht 5\' 7"  (1.702 m)  Wt 85.911 kg (189 lb 6.4 oz)  BMI 29.66 kg/m2  SpO2 93%  Intake/Output Summary (Last 24 hours) at 11/19/14 1516 Last data filed at 11/19/14 0715  Gross per 24 hour  Intake    480 ml  Output   1000 ml  Net   -520 ml   Filed Weights   11/17/14 2300 11/18/14 0105 11/19/14 0505  Weight: 84.6 kg (186 lb 8.2  oz) 84.6 kg (186 lb 8.2 oz) 85.911 kg (189 lb 6.4 oz)    Exam:   General:  Alert and oriented 3, no acute distress  Cardiovascular: Regular rate and rhythm, S1-S2  Respiratory: Clear to auscultation bilaterally  Abdomen: Soft, nontender, nondistended, positive bowel sounds  Musculoskeletal: No clubbing or cyanosis, trace edema   Data Reviewed: Basic Metabolic Panel:  Recent Labs Lab 11/17/14 1553 11/18/14 0550 11/19/14 0600  NA 141 139 137  K 3.9 3.4* 4.6  CL 107 102 102  CO2 24 27 28   GLUCOSE 114* 113* 139*  BUN 33* 33* 31*  CREATININE 2.10* 2.20* 1.90*  CALCIUM 9.0 8.6* 8.9   Liver Function Tests: No results for input(s): AST, ALT, ALKPHOS, BILITOT, PROT, ALBUMIN in the last 168 hours. No results for input(s): LIPASE, AMYLASE in the last 168 hours. No results for input(s): AMMONIA in the last 168 hours. CBC:  Recent Labs Lab 11/17/14 1553 11/19/14 0600  WBC 5.8 5.9  HGB 13.3 12.5*  HCT 38.2* 37.0*  MCV 88.0 88.5  PLT 281 298   Cardiac Enzymes:    Recent Labs Lab 11/17/14 2355 11/18/14 0550 11/18/14 1152  TROPONINI 0.07* 0.04* 0.04*   BNP (last 3 results)  Recent Labs  02/05/14 0700 11/17/14 1555  BNP 531.4* 2169.7*    ProBNP (last 3 results) No results for input(s): PROBNP in the last 8760 hours.  CBG: No results for input(s): GLUCAP in the last 168 hours.  Recent Results (from the past 240 hour(s))  MRSA PCR Screening     Status: None   Collection Time: 11/18/14  3:19 AM  Result Value Ref Range Status   MRSA by PCR NEGATIVE NEGATIVE Final    Comment:        The GeneXpert MRSA Assay (FDA approved for NASAL specimens only), is one component of a comprehensive MRSA colonization surveillance program. It is not intended to diagnose MRSA infection nor to guide or monitor treatment for MRSA infections.      Studies: No results found.  Scheduled Meds: . aspirin EC  325 mg Oral Daily  . carvedilol  12.5 mg Oral BID WC  .  furosemide  40 mg Intravenous BID  . heparin  5,000 Units Subcutaneous 3 times per day  . hydrALAZINE  10 mg Oral 3 times per day  . isosorbide mononitrate  30 mg Oral Daily  . pravastatin  20 mg Oral Daily  . predniSONE  60 mg Oral Q breakfast  . sodium chloride  3 mL Intravenous Q12H    Continuous Infusions:    Time spent: 25 minutes  Hollice Espy  [facility] Pager [phone]. If 7PM-7AM, please contact night-coverage at [email or web address], password [facility] 11/19/2014, 3:16 PM  LOS: 2 days

## 2014-11-19 NOTE — Consult Note (Addendum)
CONSULT NOTE  Date: 11/19/2014               Patient Name:  Jeffery Hodges MRN: [id number]  DOB: 08/20/1955 Age / Sex: 59 y.o., male        PCP: HOPPER,DAVID Primary Cardiologist: Eden Emms            Referring Physician: Sunnie Nielsen              Reason for Consult: CHF, HTN           History of Present Illness: Patient is a 59 y.o. male with a PMHx of HTN, hyperlipidemia, new onset CHF , who was admitted to [facility] on 11/17/2014 for evaluation of  Worsening CHF .   Pt has a hx of chronic systolic CHF.  Has seen Dr. Eden Emms in the past.  Previous eF was 30 % but it reportedly improved to normal range .  Echo in Jan. 2016 shows EF 40-45% Echo from Oct. 21, 2016 shows EF 30-35% Moderate pulmonary HTN - PA pressures 58.   Was seen by a doctor in [facility],  Lisinopril was stopped and amlodipine was started. Ran out of his meds in mid September.  No BP meds since that time  Has had increasing dyspnea 3 weeks ago    Former smoker   Works a [employer] job.   Medications: Outpatient medications: Prescriptions prior to admission  Medication Sig Dispense Refill Last Dose  . naproxen sodium (ANAPROX) 220 MG tablet Take 220 mg by mouth 2 (two) times daily as needed (pain).   Past Week at Unknown time  . amLODipine (NORVASC) 5 MG tablet Take 1 tablet (5 mg total) by mouth daily. 30 tablet 0 10/17/2014 at unknown time  . carvedilol (COREG) 12.5 MG tablet Take 1 tablet (12.5 mg total) by mouth 2 (two) times daily with a meal. 30 tablet 0 10/17/2014 at unknown time  . furosemide (LASIX) 40 MG tablet Take 1 tablet (40 mg total) by mouth daily. 30 tablet 0 10/17/2014 at unknown time  . isosorbide mononitrate (IMDUR) 30 MG 24 hr tablet Take 1 tablet (30 mg total) by mouth daily. 30 tablet 0 10/17/2014 at unknown time  . pravastatin (PRAVACHOL) 20 MG tablet Take 1 tablet (20 mg total) by mouth daily. 30 tablet 0 10/17/2014 at unknown time    Current medications: Current Facility-Administered  Medications  Medication Dose Route Frequency Provider Last Rate Last Dose  . 0.9 %  sodium chloride infusion  250 mL Intravenous PRN Yevonne Pax, MD      . acetaminophen (TYLENOL) tablet 650 mg  650 mg Oral Q4H PRN Yevonne Pax, MD      . aspirin EC tablet 325 mg  325 mg Oral Daily Yevonne Pax, MD   325 mg at 11/18/14 0946  . carvedilol (COREG) tablet 12.5 mg  12.5 mg Oral BID WC Yevonne Pax, MD   12.5 mg at 11/19/14 0744  . furosemide (LASIX) injection 40 mg  40 mg Intravenous Daily Yevonne Pax, MD   40 mg at 11/18/14 0945  . heparin injection 5,000 Units  5,000 Units Subcutaneous 3 times per day Yevonne Pax, MD   5,000 Units at 11/19/14 0607  . HYDROcodone-acetaminophen (NORCO/VICODIN) 5-325 MG per tablet 1 tablet  1 tablet Oral Q6H PRN Alba Cory, MD   1 tablet at 11/18/14 0939  . isosorbide mononitrate (IMDUR) 24 hr tablet 30 mg  30 mg Oral Daily Belkys  A Regalado, MD   30 mg at 11/18/14 0946  . ondansetron (ZOFRAN) injection 4 mg  4 mg Intravenous Q6H PRN Yevonne Pax, MD      . pravastatin (PRAVACHOL) tablet 20 mg  20 mg Oral Daily Yevonne Pax, MD   20 mg at 11/18/14 0946  . predniSONE (DELTASONE) tablet 60 mg  60 mg Oral Q breakfast Belkys A Regalado, MD   60 mg at 11/19/14 0744  . sodium chloride 0.9 % injection 3 mL  3 mL Intravenous Q12H Yevonne Pax, MD   3 mL at 11/18/14 2212  . sodium chloride 0.9 % injection 3 mL  3 mL Intravenous PRN Yevonne Pax, MD      . zolpidem (AMBIEN) tablet 5 mg  5 mg Oral QHS PRN,MR X 1 Yevonne Pax, MD         No Known Allergies   Past Medical History  Diagnosis Date  . ED (erectile dysfunction)   . HTN (hypertension)   . Chronic systolic CHF (congestive heart failure) (HCC)     EF 30% in the past => improved to normal;  echo 6/13:  Mod LVH, EF 55%, Gr 1 DD, mild LAE, normal RV sized and fxn  . Chronic kidney disease   . Hyperlipidemia   . Cardiomyopathy (HCC)     likley HTN;  EF 30% => improved to normal (echo in 2012 and  2013 with normal LVF);  myoview 2004: inf defect likely diaph atten, no ischemia    History reviewed. No pertinent past surgical history.  No family history on file.  Social History:  reports that he has quit smoking. He does not have any smokeless tobacco history on file. He reports that he drinks about 1.8 oz of alcohol per week. He reports that he does not use illicit drugs.   Review of Systems: Constitutional:  denies fever, chills, diaphoresis, appetite change and fatigue.  HEENT: denies photophobia, eye pain, redness, hearing loss, ear pain, congestion, sore throat, rhinorrhea, sneezing, neck pain, neck stiffness and tinnitus.  Respiratory: admits to SOB, DOE, cough, chest tightness, and wheezing.  Cardiovascular: denies chest pain, palpitations and leg swelling.  Gastrointestinal: denies nausea, vomiting, abdominal pain, diarrhea, constipation, blood in stool.  Genitourinary: denies dysuria, urgency, frequency, hematuria, flank pain and difficulty urinating.  Musculoskeletal: denies  myalgias, back pain, joint swelling, arthralgias and gait problem.   Skin: denies pallor, rash and wound.  Neurological: denies dizziness, seizures, syncope, weakness, light-headedness, numbness and headaches.   Hematological: denies adenopathy, easy bruising, personal or family bleeding history.  Psychiatric/ Behavioral: denies suicidal ideation, mood changes, confusion, nervousness, sleep disturbance and agitation.    Physical Exam: BP 143/113 mmHg  Pulse 69  Temp(Src) 97.5 F (36.4 C) (Oral)  Resp 16  Ht  (1.702 m)  Wt 189 lb 6.4 oz (85.911 kg)  BMI 29.66 kg/m2  SpO2 96%  Wt Readings from Last 3 Encounters:  11/19/14 189 lb 6.4 oz (85.911 kg)  09/16/14 185 lb (83.915 kg)  02/08/14 192 lb 9.6 oz (87.363 kg)    General: Vital signs reviewed and noted. Well-developed, well-nourished, in no acute distress; alert,   Head: Normocephalic, atraumatic, sclera anicteric,   Neck: Supple.  Negative for carotid bruits. No JVD   Lungs:  Clear bilaterally, no  wheezes, rales, or rhonchi. Breathing is normal   Heart: RRR with S1 S2. No murmurs, rubs, or gallops   Abdomen/ GI :  Soft, non-tender, non-distended with normoactive bowel  sounds. No hepatomegaly. No rebound/guarding. No obvious abdominal masses   MSK: Strength and the appear normal for age.   Extremities: No clubbing or cyanosis. No edema.  Distal pedal pulses are 2+ and equal   Neurologic:  CN are grossly intact,  No obvious motor or sensory defect.  Alert and oriented X 3. Moves all extremities spontaneously.  Psych: Responds to questions appropriately with a normal affect.     Lab results: Basic Metabolic Panel:  Recent Labs Lab 11/17/14 1553 11/18/14 0550 11/19/14 0600  NA 141 139 137  K 3.9 3.4* 4.6  CL 107 102 102  CO2 24 27 28   GLUCOSE 114* 113* 139*  BUN 33* 33* 31*  CREATININE 2.10* 2.20* 1.90*  CALCIUM 9.0 8.6* 8.9    Liver Function Tests: No results for input(s): AST, ALT, ALKPHOS, BILITOT, PROT, ALBUMIN in the last 168 hours. No results for input(s): LIPASE, AMYLASE in the last 168 hours. No results for input(s): AMMONIA in the last 168 hours.  CBC:  Recent Labs Lab 11/17/14 1553 11/19/14 0600  WBC 5.8 5.9  HGB 13.3 12.5*  HCT 38.2* 37.0*  MCV 88.0 88.5  PLT 281 298    Cardiac Enzymes:  Recent Labs Lab 11/17/14 2355 11/18/14 0550 11/18/14 1152  TROPONINI 0.07* 0.04* 0.04*    BNP: Invalid input(s): POCBNP  CBG: No results for input(s): GLUCAP in the last 168 hours.  Coagulation Studies:  Recent Labs  11/17/14 2355  LABPROT 15.6*  INR 1.22     Other results:  Personal review of EKG shows :  NSR    Imaging: Dg Chest 2 View  11/17/2014  CLINICAL DATA:  Shortness of breath for 2-3 weeks, hypertension, former smoker, chronic systolic CHF, cardiomyopathy, chronic kidney disease, hyperlipidemia EXAM: CHEST  2 VIEW COMPARISON:  02/05/2014 FINDINGS: Enlargement of  cardiac silhouette with pulmonary vascular congestion. Tortuous aorta. Decreased accentuation of interstitial markings compatible with improved failure. No segmental consolidation, pleural effusion or pneumothorax. Bones unremarkable. IMPRESSION: Enlargement of cardiac silhouette with pulmonary vascular congestion. Improved pulmonary edema since previous exam. Electronically Signed   By: Ulyses Southward M.D.   On: 11/17/2014 18:12   Dg Foot Complete Right  11/18/2014  CLINICAL DATA:  Severe chronic foot pain.  History of gout. EXAM: RIGHT FOOT COMPLETE - 3+ VIEW COMPARISON:  None. FINDINGS: Moderate degenerative changes at the first metatarsal phalangeal joint with mild hallux valgus deformity. There is joint space narrowing and erosive changes consistent with gout. The other metatarsal phalangeal joints are maintained. No significant degenerative changes involving the midfoot or hindfoot. Mild pes planus. IMPRESSION: Findings consistent with gouty arthritis involving the first metatarsal phalangeal joint. Electronically Signed   By: Rudie Meyer M.D.   On: 11/18/2014 09:07       Assessment & Plan:  1. Chronic systolic CHF: I suspect his slight decrease in his EF is due to the fact that he had not been taking any of his bP meds for the past month or so ( ran out of money) I think that he will get back to his previous EF with restarting his med.s His lisinopril has been stopped due to CKD. I would start Hydralazine 10 TID ( alternatively could use 25 BID )  Have him see Dr. Alwyn Ren for follow up and can see Dr. Eden Emms  In several months .  He can see our PA for sooner follow up if needed.  Can change lasix to po    We can titrate meds as  neeed   2. Essential HTN - restart meds.    3. CKD:     Probably ok to go home today     Vesta Mixer, Montez Hageman., MD, [facility] 11/19/2014, 9:18 AM Office - [phone] Pager [phone]

## 2014-11-20 DIAGNOSIS — N183 Chronic kidney disease, stage 3 unspecified: Secondary | ICD-10-CM | POA: Diagnosis present

## 2014-11-20 DIAGNOSIS — E663 Overweight: Secondary | ICD-10-CM | POA: Diagnosis present

## 2014-11-20 DIAGNOSIS — I160 Hypertensive urgency: Secondary | ICD-10-CM

## 2014-11-20 DIAGNOSIS — M10379 Gout due to renal impairment, unspecified ankle and foot: Secondary | ICD-10-CM

## 2014-11-20 DIAGNOSIS — M109 Gout, unspecified: Secondary | ICD-10-CM | POA: Diagnosis present

## 2014-11-20 HISTORY — DX: Chronic kidney disease, stage 3 unspecified: N18.30

## 2014-11-20 HISTORY — DX: Overweight: E66.3

## 2014-11-20 HISTORY — DX: Gout due to renal impairment, unspecified ankle and foot: M10.379

## 2014-11-20 LAB — BASIC METABOLIC PANEL
ANION GAP: 8 (ref 5–15)
BUN: 27 mg/dL — ABNORMAL HIGH (ref 6–20)
CHLORIDE: 96 mmol/L — AB (ref 101–111)
CO2: 31 mmol/L (ref 22–32)
Calcium: 8.5 mg/dL — ABNORMAL LOW (ref 8.9–10.3)
Creatinine, Ser: 1.79 mg/dL — ABNORMAL HIGH (ref 0.61–1.24)
GFR, EST AFRICAN AMERICAN: 46 mL/min — AB (ref >60)
GFR, EST NON AFRICAN AMERICAN: 39 mL/min — AB (ref >60)
Glucose, Bld: 108 mg/dL — ABNORMAL HIGH (ref 65–99)
POTASSIUM: 3.4 mmol/L — AB (ref 3.5–5.1)
SODIUM: 135 mmol/L (ref 135–145)

## 2014-11-20 LAB — BRAIN NATRIURETIC PEPTIDE: B NATRIURETIC PEPTIDE 5: 875.1 pg/mL — AB (ref 0.0–100.0)

## 2014-11-20 MED ORDER — HYDRALAZINE HCL 25 MG PO TABS: 25.0000 mg | ORAL_TABLET | Freq: Two times a day (BID) | ORAL | Status: DC

## 2014-11-20 MED ORDER — FUROSEMIDE 40 MG PO TABS: 40.0000 mg | ORAL_TABLET | Freq: Two times a day (BID) | ORAL | Status: DC

## 2014-11-20 MED ORDER — CARVEDILOL 12.5 MG PO TABS: 12.5000 mg | ORAL_TABLET | Freq: Two times a day (BID) | ORAL | Status: DC

## 2014-11-20 MED ORDER — PRAVASTATIN SODIUM 20 MG PO TABS: 20.0000 mg | ORAL_TABLET | Freq: Every day | ORAL | Status: DC

## 2014-11-20 MED ORDER — POTASSIUM CHLORIDE ER 20 MEQ PO TBCR: 20.0000 meq | EXTENDED_RELEASE_TABLET | Freq: Every day | ORAL | Status: DC

## 2014-11-20 MED ORDER — ISOSORBIDE MONONITRATE ER 30 MG PO TB24: 30.0000 mg | ORAL_TABLET | Freq: Every day | ORAL | Status: DC

## 2014-11-20 MED ORDER — SPIRONOLACTONE 25 MG PO TABS
25.0000 mg | ORAL_TABLET | Freq: Every day | ORAL | Status: DC
  Administered 2014-11-20: 25 mg via ORAL
  Filled 2014-11-20: qty 1

## 2014-11-20 MED ORDER — PREDNISONE 10 MG PO TABS: ORAL_TABLET | ORAL | Status: DC

## 2014-11-20 MED ORDER — ASPIRIN EC 81 MG PO TBEC: 81.0000 mg | DELAYED_RELEASE_TABLET | Freq: Every day | ORAL | Status: DC

## 2014-11-20 MED ORDER — ALLOPURINOL 100 MG PO TABS: ORAL_TABLET | ORAL | Status: DC

## 2014-11-20 NOTE — Discharge Summary (Signed)
Discharge Summary  Jeffery Hodges:[id number] DOB: April 11, 1955  PCP: Janace Hoard, MD  Admit date: 11/17/2014 Discharge date: 11/20/2014  Time spent: 35 minutes  Recommendations for Outpatient Follow-up:  1. New medication: Prednisone taper 2. New medication: Allopurinol 100 mg increased by 100 mg weekly until 300 mg dose daily reached. Start after prednisone taper complete. 3. New medication: Hydralazine 25 mg by mouth twice a day 4. New medication: K Dur 20 mEq by mouth daily 5. Medication change: Lisinopril discontinued due to renal dysfunction 6. Patient received refills on his Coreg, Imdur and Pravachol 7. Medication change: Lasix increased to 40 mg by mouth twice a day 8. New medication: Aspirin 81 mg by mouth daily 9. Patient will be followed by [facility] heart care CHF clinic in the next few weeks 10. Home health nurse will follow-up with patient at home  Discharge Diagnoses:  Active Hospital Problems   Diagnosis Date Noted  . Acute on chronic combined systolic and diastolic heart failure (HCC) 02/05/2014  . CKD (chronic kidney disease) stage 3, GFR 30-59 ml/min 11/20/2014  . Overweight (BMI 25.0-29.9) 11/20/2014  . Acute gout due to renal impairment involving foot 11/20/2014  . Acute on chronic kidney failure (HCC) 11/17/2014  . HTN (hypertension) 10/10/2010    Resolved Hospital Problems   Diagnosis Date Noted Date Resolved  No resolved problems to display.    Discharge Condition: Improved, being discharged home  Diet recommendation: Heart healthy  Filed Weights   11/18/14 0105 11/19/14 0505 11/20/14 0515  Weight: 84.6 kg (186 lb 8.2 oz) 85.911 kg (189 lb 6.4 oz) 86.32 kg (190 lb 4.8 oz)    History of present illness:  59 year old male with past history of mild systolic/diastolic heart failure plus CK DN hypertension who lost his job several months ago and stopped taking his medications one month ago. He was admitted for congestive heart failure exacerbation on  10/20 after noting several days of increasing dyspnea on exertion. Initially patient was complaining of some mild chest pressure but with diuretics and nitroglycerin, he felt better.  Hospital Course:  Principal Problem:   Acute on chronic combined systolic and diastolic heart failure (HCC): Responded well to diuretics. Repeat echocardiogram noted significant decrease in ejection fraction and diastolic dysfunction so cardiology consulted. By day of discharge, patient had diuresed several liters. Medications adjusted. Patient will follow-up with cardiology as outpatient at the CHF clinic starting January 1, he will resume insurance Active Problems: Essential hypertension with acute hypertensive urgency: Secondary to medication noncompliance and volume overload. With diuresis and restarting medications, patient much improved. We started on Coreg and Imdur. Lisinopril discontinued due to renal dysfunction. Started on hydralazine as per cardiology.    Acute kidney injury in the setting of CKD (chronic kidney disease) stage 3, GFR 30-59 ml/min colon with diuresis, renal function improved and back to baseline. Creatinine on day of discharge at 1.79    Overweight (BMI 25.0-29.9): Patient meets criteria with BMI greater than 30    Acute gout due to renal impairment involving foot: Patient on day 2 complaining of severe right toe pain. Uric acid level at 10. Felt to be an acute gout attack. Given renal dysfunction, started on prednisone. Improved. Plan will be to continue prednisone for the next 10 days. Once this is taper off, will taper up allopurinol.   Procedures:  Echocardiogram done 10/21: Ejection fraction now 35%, diastolic dysfunction now grade 3  Consultations:  Cardiology  Discharge Exam: BP 130/100 mmHg  Pulse 74  Temp(Src) 97.9 F (  36.6 C) (Oral)  Resp 18  Ht  (1.702 m)  Wt 86.32 kg (190 lb 4.8 oz)  BMI 29.80 kg/m2  SpO2 98%  General: alert and oriented 3  Cardiovascular:   regular rate and rhythm, S1 and S2   Respiratory: clear to auscultation bilaterally  Discharge Instructions You were cared for by a hospitalist during your hospital stay. If you have any questions about your discharge medications or the care you received while you were in the hospital after you are discharged, you can call the unit and asked to speak with the hospitalist on call if the hospitalist that took care of you is not available. Once you are discharged, your primary care physician will handle any further medical issues. Please note that NO REFILLS for any discharge medications will be authorized once you are discharged, as it is imperative that you return to your primary care physician (or establish a relationship with a primary care physician if you do not have one) for your aftercare needs so that they can reassess your need for medications and monitor your lab values.  Discharge Instructions    Diet - low sodium heart healthy    Complete by:  As directed      Increase activity slowly    Complete by:  As directed             Medication List    STOP taking these medications        amLODipine 5 MG tablet  Commonly known as:  NORVASC      TAKE these medications        allopurinol 100 MG tablet  Commonly known as:  ZYLOPRIM  100 mg po daily x 1 week, then increase to  po daily x 1 week, then increase to  po daily     aspirin EC 81 MG tablet  Take 1 tablet (81 mg total) by mouth daily.  Notes to Patient:  Only start this medicine after the prednisone is finished     carvedilol 12.5 MG tablet  Commonly known as:  COREG  Take 1 tablet (12.5 mg total) by mouth 2 (two) times daily with a meal.     furosemide 40 MG tablet  Commonly known as:  LASIX  Take 1 tablet (40 mg total) by mouth 2 (two) times daily.     hydrALAZINE 25 MG tablet  Commonly known as:  APRESOLINE  Take 1 tablet (25 mg total) by mouth 2 (two) times daily.     isosorbide mononitrate 30 MG 24  hr tablet  Commonly known as:  IMDUR  Take 1 tablet (30 mg total) by mouth daily.     naproxen sodium 220 MG tablet  Commonly known as:  ANAPROX  Take 220 mg by mouth 2 (two) times daily as needed (pain).     Potassium Chloride ER 20 MEQ Tbcr  Take 20 mEq by mouth daily.     pravastatin 20 MG tablet  Commonly known as:  PRAVACHOL  Take 1 tablet (20 mg total) by mouth daily.     predniSONE 10 MG tablet  Commonly known as:  DELTASONE  50 mg po daily x 2 days, then  po daily x 2 days, then 30 mg po daily x 2 days, then  po daily x 2 days, then 10 mg daily x 2 days       No Known Allergies     Follow-up Information    Follow up with HOPPER,DAVID, MD. Schedule  an appointment as soon as possible for a visit on 11/17/2014.   Specialty:  Family Medicine   Contact information:   [address] [place] [address] [phone]       Follow up with Please use the resources given to you in the Emergency room to assist with discount medications and  doctors  On 11/17/2014.   Why:  As needed   Contact information:   [email or web address]      Follow up with [facility].   Why:  They will call you tomorrow (Monday) to set up a follow up visit   Contact information:   [address] [place] [address] [phone]       The results of significant diagnostics from this hospitalization (including imaging, microbiology, ancillary and laboratory) are listed below for reference.    Significant Diagnostic Studies: Dg Chest 2 View  11/17/2014  CLINICAL DATA:  Shortness of breath for 2-3 weeks, hypertension, former smoker, chronic systolic CHF, cardiomyopathy, chronic kidney disease, hyperlipidemia EXAM: CHEST  2 VIEW COMPARISON:  02/05/2014 FINDINGS: Enlargement of cardiac silhouette with pulmonary vascular congestion. Tortuous aorta. Decreased accentuation of interstitial markings compatible with improved  failure. No segmental consolidation, pleural effusion or pneumothorax. Bones unremarkable. IMPRESSION: Enlargement of cardiac silhouette with pulmonary vascular congestion. Improved pulmonary edema since previous exam. Electronically Signed   By: Ulyses Southward M.D.   On: 11/17/2014 18:12   Dg Foot Complete Right  11/18/2014  CLINICAL DATA:  Severe chronic foot pain.  History of gout. EXAM: RIGHT FOOT COMPLETE - 3+ VIEW COMPARISON:  None. FINDINGS: Moderate degenerative changes at the first metatarsal phalangeal joint with mild hallux valgus deformity. There is joint space narrowing and erosive changes consistent with gout. The other metatarsal phalangeal joints are maintained. No significant degenerative changes involving the midfoot or hindfoot. Mild pes planus. IMPRESSION: Findings consistent with gouty arthritis involving the first metatarsal phalangeal joint. Electronically Signed   By: Rudie Meyer M.D.   On: 11/18/2014 09:07    Microbiology: Recent Results (from the past 240 hour(s))  MRSA PCR Screening     Status: None   Collection Time: 11/18/14  3:19 AM  Result Value Ref Range Status   MRSA by PCR NEGATIVE NEGATIVE Final    Comment:        The GeneXpert MRSA Assay (FDA approved for NASAL specimens only), is one component of a comprehensive MRSA colonization surveillance program. It is not intended to diagnose MRSA infection nor to guide or monitor treatment for MRSA infections.      Labs: Basic Metabolic Panel:  Recent Labs Lab 11/17/14 1553 11/18/14 0550 11/19/14 0600 11/20/14 0504  NA 141 139 137 135  K 3.9 3.4* 4.6 3.4*  CL 107 102 102 96*  CO2 24 27 28 31   GLUCOSE 114* 113* 139* 108*  BUN 33* 33* 31* 27*  CREATININE 2.10* 2.20* 1.90* 1.79*  CALCIUM 9.0 8.6* 8.9 8.5*   Liver Function Tests: No results for input(s): AST, ALT, ALKPHOS, BILITOT, PROT, ALBUMIN in the last 168 hours. No results for input(s): LIPASE, AMYLASE in the last 168 hours. No results for  input(s): AMMONIA in the last 168 hours. CBC:  Recent Labs Lab 11/17/14 1553 11/19/14 0600  WBC 5.8 5.9  HGB 13.3 12.5*  HCT 38.2* 37.0*  MCV 88.0 88.5  PLT 281 298   Cardiac Enzymes:  Recent Labs Lab 11/17/14 2355 11/18/14 0550 11/18/14 1152  TROPONINI 0.07* 0.04* 0.04*   BNP:  BNP (last 3 results)  Recent Labs  02/05/14 0700 11/17/14 1555 11/20/14 0505  BNP 531.4* 2169.7* 875.1*    ProBNP (last 3 results) No results for input(s): PROBNP in the last 8760 hours.  CBG: No results for input(s): GLUCAP in the last 168 hours.     Signed:  Hollice Espy  [facility] 11/20/2014, 3:19 PM

## 2014-11-20 NOTE — Progress Notes (Signed)
PROGRESS NOTE  Subjective:   Patient is a 59 y.o. male with a PMHx of HTN, hyperlipidemia, new onset CHF , who was admitted to [facility] on 11/17/2014 for evaluation of Worsening CHF .   Had run out of money and therefore run out of meds.  Doing better now that he is back on some meds. Have started hydralazine yesterday   Objective:    Vital Signs:   Temp:  [97.9 F (36.6 C)-98 F (36.7 C)] 97.9 F (36.6 C) (10/23 0515) Pulse Rate:  [71-74] 74 (10/23 0515) Resp:  [16-18] 18 (10/22 2111) BP: (130-150)/(91-100) 130/100 mmHg (10/23 0515) SpO2:  [93 %-98 %] 98 % (10/23 0515) Weight:  [190 lb 4.8 oz (86.32 kg)] 190 lb 4.8 oz (86.32 kg) (10/23 0515)  Last BM Date: 11/17/14   24-hour weight change: Weight change: 14.4 oz (0.408 kg)  Weight trends: Filed Weights   11/18/14 0105 11/19/14 0505 11/20/14 0515  Weight: 186 lb 8.2 oz (84.6 kg) 189 lb 6.4 oz (85.911 kg) 190 lb 4.8 oz (86.32 kg)    Intake/Output:  10/22 0701 - 10/23 0700 In: 480 [P.O.:480] Out: -      Physical Exam: BP 130/100 mmHg  Pulse 74  Temp(Src) 97.9 F (36.6 C) (Oral)  Resp 18  Ht  (1.702 m)  Wt 190 lb 4.8 oz (86.32 kg)  BMI 29.80 kg/m2  SpO2 98%  Wt Readings from Last 3 Encounters:  11/20/14 190 lb 4.8 oz (86.32 kg)  09/16/14 185 lb (83.915 kg)  02/08/14 192 lb 9.6 oz (87.363 kg)    General: Vital signs reviewed and noted.   Head: Normocephalic, atraumatic.  Eyes: conjunctivae/corneas clear.  EOM's intact.   Throat: normal  Neck:  normal   Lungs:    clear   Heart:  RR ,   Abdomen:  Soft, non-tender, non-distended    Extremities: No edema    Neurologic: A&O X3, CN II - XII are grossly intact.   Psych: Normal     Labs: BMET:  Recent Labs  11/19/14 0600 11/20/14 0504  NA 137 135  K 4.6 3.4*  CL 102 96*  CO2 28 31  GLUCOSE 139* 108*  BUN 31* 27*  CREATININE 1.90* 1.79*  CALCIUM 8.9 8.5*    Liver function tests: No results for input(s): AST, ALT, ALKPHOS, BILITOT,  PROT, ALBUMIN in the last 72 hours. No results for input(s): LIPASE, AMYLASE in the last 72 hours.  CBC:  Recent Labs  11/17/14 1553 11/19/14 0600  WBC 5.8 5.9  HGB 13.3 12.5*  HCT 38.2* 37.0*  MCV 88.0 88.5  PLT 281 298    Cardiac Enzymes:  Recent Labs  11/17/14 2355 11/18/14 0550 11/18/14 1152  TROPONINI 0.07* 0.04* 0.04*    Coagulation Studies:  Recent Labs  11/17/14 2355  LABPROT 15.6*  INR 1.22    Other: Invalid input(s): POCBNP No results for input(s): DDIMER in the last 72 hours. No results for input(s): HGBA1C in the last 72 hours. No results for input(s): CHOL, HDL, LDLCALC, TRIG, CHOLHDL in the last 72 hours.  Recent Labs  11/17/14 2355  TSH 1.307   No results for input(s): VITAMINB12, FOLATE, FERRITIN, TIBC, IRON, RETICCTPCT in the last 72 hours.   Other results:  tele ( personally reviewed )  -NSR   Medications:    Infusions:    Scheduled Medications: . aspirin EC  325 mg Oral Daily  . carvedilol  12.5 mg Oral BID WC  .  furosemide  40 mg Intravenous BID  . heparin  5,000 Units Subcutaneous 3 times per day  . hydrALAZINE  10 mg Oral 3 times per day  . isosorbide mononitrate  30 mg Oral Daily  . pravastatin  20 mg Oral Daily  . predniSONE  60 mg Oral Q breakfast  . sodium chloride  3 mL Intravenous Q12H    Assessment/ Plan:   Principal Problem:   Acute on chronic combined systolic and diastolic heart failure (HCC) Active Problems:   HTN (hypertension)   Acute on chronic kidney failure (HCC)  1. Acute on chronic CHF:  Doing better.  Consider adding aldactone  -will need BMP in a week  Continue hydralazine , titrate up as tolerated.   2. Essential HTN:  See above.   Probably can be discharged today   Will follow up with Dr. Eden Emms in the [facility] if he needs cardiology follow up     Disposition:  Length of Stay: 3  Vesta Mixer, Montez Hageman., MD, [facility] 11/20/2014, 8:11 AM Office [phone] Pager [phone]

## 2014-11-20 NOTE — Care Management Note (Signed)
Case Management Note  Patient Details  Name: Jeraldo Vivier MRN: [id number] Date of Birth: [birth year]  Subjective/Objective:    Gout, AKI                Action/Plan: NCM made [facility] aware of dc home today. HH RN orders received.   Please see previous RN CM notes.   Expected Discharge Date:  11/20/2014               Expected Discharge Plan:  Home w Home Health Services  In-House Referral:  NA  Discharge planning Services  CM Consult  Post Acute Care Choice:  Home Health, Resumption of Svcs/PTA Provider Choice offered to:  Patient      HH Arranged:  Disease Management, RN HH Agency:  [facility]  Status of Service:  complete  Medicare Important Message Given:    Date Medicare IM Given:    Medicare IM give by:    Date Additional Medicare IM Given:    Additional Medicare Important Message give by:     If discussed at Long Length of Stay Meetings, dates discussed:    Additional Comments:  Elliot Cousin, RN 11/20/2014, 8:37 PM

## 2014-11-20 NOTE — Discharge Instructions (Signed)
Gout Gout is when your joints become red, sore, and swell (inflamed). This is caused by the buildup of uric acid crystals in the joints. Uric acid is a chemical that is normally in the blood. If the level of uric acid gets too high in the blood, these crystals form in your joints and tissues. Over time, these crystals can form into masses near the joints and tissues. These masses can destroy bone and cause the bone to look misshapen (deformed). HOME CARE   Do not take aspirin for pain.  Only take medicine as told by your doctor.  Rest the joint as much as you can. When in bed, keep sheets and blankets off painful areas.  Keep the sore joints raised (elevated).  Put warm or cold packs on painful joints. Use of warm or cold packs depends on which works best for you.  Use crutches if the painful joint is in your leg.  Drink enough fluids to keep your pee (urine) clear or pale yellow. Limit alcohol, sugary drinks, and drinks with fructose in them.  Follow your diet instructions. Pay careful attention to how much protein you eat. Include fruits, vegetables, whole grains, and fat-free or low-fat milk products in your daily diet. Talk to your doctor or dietitian about the use of coffee, vitamin C, and cherries. These may help lower uric acid levels.  Keep a healthy body weight. GET HELP RIGHT AWAY IF:   You have watery poop (diarrhea), throw up (vomit), or have any side effects from medicines.  You do not feel better in 24 hours, or you are getting worse.  Your joint becomes suddenly more tender, and you have chills or a fever. MAKE SURE YOU:   Understand these instructions.  Will watch your condition.  Will get help right away if you are not doing well or get worse.   This information is not intended to replace advice given to you by your health care provider. Make sure you discuss any questions you have with your health care provider.   Document Released: 10/24/2007 Document Revised:  02/04/2014 Document Reviewed: 08/28/2011 Elsevier Interactive Patient Education 2016 Elsevier Inc. Heart Failure Heart failure means your heart has trouble pumping blood. This makes it hard for your body to work well. Heart failure is usually a long-term (chronic) condition. You must take good care of yourself and follow your doctor's treatment plan. HOME CARE  Take your heart medicine as told by your doctor.  Do not stop taking medicine unless your doctor tells you to.  Do not skip any dose of medicine.  Refill your medicines before they run out.  Take other medicines only as told by your doctor or pharmacist.  Stay active if told by your doctor. The elderly and people with severe heart failure should talk with a doctor about physical activity.  Eat heart-healthy foods. Choose foods that are without trans fat and are low in saturated fat, cholesterol, and salt (sodium). This includes fresh or frozen fruits and vegetables, fish, lean meats, fat-free or low-fat dairy foods, whole grains, and high-fiber foods. Lentils and dried peas and beans (legumes) are also good choices.  Limit salt if told by your doctor.  Cook in a healthy way. Roast, grill, broil, bake, poach, steam, or stir-fry foods.  Limit fluids as told by your doctor.  Weigh yourself every morning. Do this after you pee (urinate) and before you eat breakfast. Write down your weight to give to your doctor.  Take your blood pressure  and write it down if your doctor tells you to.  Ask your doctor how to check your pulse. Check your pulse as told.  Lose weight if told by your doctor.  Stop smoking or chewing tobacco. Do not use gum or patches that help you quit without your doctor's approval.  Schedule and go to doctor visits as told.  Nonpregnant women should have no more than 1 drink a day. Men should have no more than 2 drinks a day. Talk to your doctor about drinking alcohol.  Stop illegal drug use.  Stay current  with shots (immunizations).  Manage your health conditions as told by your doctor.  Learn to manage your stress.  Rest when you are tired.  If it is really hot outside:  Avoid intense activities.  Use air conditioning or fans, or get in a cooler place.  Avoid caffeine and alcohol.  Wear loose-fitting, lightweight, and light-colored clothing.  If it is really cold outside:  Avoid intense activities.  Layer your clothing.  Wear mittens or gloves, a hat, and a scarf when going outside.  Avoid alcohol.  Learn about heart failure and get support as needed.  Get help to maintain or improve your quality of life and your ability to care for yourself as needed. GET HELP IF:   You gain weight quickly.  You are more short of breath than usual.  You cannot do your normal activities.  You tire easily.  You cough more than normal, especially with activity.  You have any or more puffiness (swelling) in areas such as your hands, feet, ankles, or belly (abdomen).  You cannot sleep because it is hard to breathe.  You feel like your heart is beating fast (palpitations).  You get dizzy or light-headed when you stand up. GET HELP RIGHT AWAY IF:   You have trouble breathing.  There is a change in mental status, such as becoming less alert or not being able to focus.  You have chest pain or discomfort.  You faint. MAKE SURE YOU:   Understand these instructions.  Will watch your condition.  Will get help right away if you are not doing well or get worse.   This information is not intended to replace advice given to you by your health care provider. Make sure you discuss any questions you have with your health care provider.   Document Released: 10/24/2007 Document Revised: 02/04/2014 Document Reviewed: 03/02/2012 Elsevier Interactive Patient Education [employer]

## 2014-11-30 DIAGNOSIS — N529 Male erectile dysfunction, unspecified: Secondary | ICD-10-CM | POA: Insufficient documentation

## 2014-11-30 NOTE — Progress Notes (Signed)
Cardiology Office Note   Date:  12/01/2014   ID:  Jeffery Hodges, DOB Jul 24, 1955, MRN [id number]  PCP:  Janace Hoard, MD  Cardiologist:  Dr. Eden Emms    Post hospital follow up     History of Present Illness: Jeffery Hodges is a 59 y.o. male who presents for HTN, HLD, chronic systolic/diastolic CHF, and CKD who was recently admitted to [facility] for acute on chronic systolic CHF in the setting of uncontrolled HTN (due to running out of his medicines).  Per notes he previously had an EF of 30% that improved to normal in 2013. His last 2D ECHO in 01/2014 showed EF 40-45% and G1DD. Myoview in 2004 with no ischemia. The patient lost his job/insurance in 09/2014 and could not afford his medications and stopped taking his meds.  He was admitted for congestive heart failure exacerbation on 11/17/14 after noting several days of increasing dyspnea on exertion. Initially patient was complaining of some mild chest pressure but with diuretics and nitroglycerin, he felt better. He responded well to diuretics. Repeat echocardiogram noted significant decrease in ejection fraction ( 30-35%), G3DD, and mod Pulm HTN (PA pk pressure Hg). By day of discharge, patient had diuresed several liters. Discharge weight 190 lbs. Dr Elease Hashimoto saw in consult and felt that his newly worsened EF was due to uncontrolled HTN and that his systolic function would likely improve with resumption of medications. The patient's lisinopril was discontinued due to renal dysfunction and hydralazine  BID + imdur  was added. He was discharged on lasix  BID as well as coreg and pravistatin. His hospital course was further c/b a gout flare and prednisone and allopurinol were added. He was discharged on 11/20/14.  Today he presents for post hospital follow up. He is feeling quite well. No CP, SOB, LE edema, orthopnea, PND or palpations. He has been taking all of his medications and follows salt/fluid restriction. He does weigh himself daily.   No real complaints today.     Past Medical History  Diagnosis Date  . ED (erectile dysfunction)   . HTN (hypertension)   . Chronic systolic CHF (congestive heart failure) (HCC)     EF 30% in the past => improved to normal;  echo 6/13:  Mod LVH, EF 55%, Gr 1 DD, mild LAE, normal RV sized and fxn  . Chronic kidney disease   . Hyperlipidemia   . Cardiomyopathy (HCC)     likley HTN;  EF 30% => improved to normal (echo in 2012 and 2013 with normal LVF);  myoview 2004: inf defect likely diaph atten, no ischemia  . Acute on chronic combined systolic and diastolic heart failure (HCC) 02/05/2014  . 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 Uncontrolled hypertension 02/05/2014  . Acute gout due to renal impairment involving foot 11/20/2014  . Acute on chronic kidney failure (HCC) 11/17/2014  . CKD (chronic kidney disease) stage 3, GFR 30-59 ml/min 11/20/2014  . CRF (chronic renal failure) 10/10/2010  . Overweight (BMI 25.0-29.9) 11/20/2014  . SOB (shortness of breath) 02/05/2014    No past surgical history on file.   Current Outpatient Prescriptions  Medication Sig Dispense Refill  . allopurinol (ZYLOPRIM) 100 MG tablet 100 mg po daily x 1 week, then increase to  po daily x 1 week, then increase to  po daily 90 tablet 2  . aspirin EC 81 MG tablet Take 1 tablet (81 mg total) by mouth daily. 30 tablet 3  . carvedilol (COREG) 12.5 MG tablet Take 1 tablet (  12.5 mg total) by mouth 2 (two) times daily with a meal. 30 tablet 3  . furosemide (LASIX) 40 MG tablet Take 1 tablet (40 mg total) by mouth 2 (two) times daily. 60 tablet 2  . hydrALAZINE (APRESOLINE) 25 MG tablet Take 1 tablet (25 mg total) by mouth 2 (two) times daily. 60 tablet 3  . isosorbide mononitrate (IMDUR) 30 MG 24 hr tablet Take 1 tablet (30 mg total) by mouth daily. 30 tablet 3  . naproxen sodium (ANAPROX) 220 MG tablet Take 220 mg by mouth 2 (two) times daily as needed (pain).    . potassium chloride 20 MEQ TBCR Take 20 mEq by  mouth daily. 30 tablet 3  . pravastatin (PRAVACHOL) 20 MG tablet Take 1 tablet (20 mg total) by mouth daily. 30 tablet 3  . predniSONE (DELTASONE) 10 MG tablet 50 mg po daily x 2 days, then 40mg  po daily x 2 days, then 30 mg po daily x 2 days, then 20mg  po daily x 2 days, then 10 mg daily x 2 days 30 tablet 0   No current facility-administered medications for this visit.    Allergies:   Review of patient's allergies indicates no known allergies.    Social History:  The patient  reports that he has quit smoking. He does not have any smokeless tobacco history on file. He reports that he drinks about 1.8 oz of alcohol per week. He reports that he does not use illicit drugs.   Family History: Mother died of lupus and father died of prostate cancer. He hs three sisters and one died of unknown causes. He does not know if they have any other medical problems   ROS:  Please see the history of present illness.   Otherwise, review of systems are positive for none.   All other systems are reviewed and negative.    PHYSICAL EXAM: VS:  BP 132/88 mmHg  Pulse 93  Ht 5\' 7"  (1.702 m)  Wt 192 lb (87.091 kg)  BMI 30.06 kg/m2 , BMI Body mass index is 30.06 kg/(m^2). GEN: Well nourished, well developed, in no acute distress HEENT: normal Neck: no JVD, carotid bruits, or masses Cardiac: RRR; no murmurs, rubs, or gallops,no edema  Respiratory:  clear to auscultation bilaterally, normal work of breathing GI: soft, nontender, nondistended, + BS MS: no deformity or atrophy Skin: warm and dry, no rash Neuro:  Strength and sensation are intact Psych: euthymic mood, full affect   EKG:  EKG is not ordered today.    Recent Labs: 02/06/2014: ALT 15 11/17/2014: TSH 1.307 11/19/2014: Hemoglobin 12.5*; Platelets 298 11/20/2014: B Natriuretic Peptide 875.1*; BUN 27*; Creatinine, Ser 1.79*; Potassium 3.4*; Sodium 135       Wt Readings from Last 3 Encounters:  12/01/14 192 lb (87.091 kg)  11/20/14 190 lb  4.8 oz (86.32 kg)  09/16/14 185 lb (83.915 kg)      Other studies Reviewed: Additional studies/ records that were reviewed today include: 2D ECHO, myoview  Review of the above records demonstrates: ECHO 11/18/14 EF 30-35%, G3DD, and mod Pulm HTN (PA pk pressure Hg).  Myoview 2004- no ischemia.   ASSESSMENT AND PLAN:  Jeffery Hodges is a 59 y.o. male who presents for HTN, HLD, chronic systolic/diastolic CHF, and CKD who was recently admitted to [facility] for acute on chronic systolic CHF in the setting of uncontrolled HTN (due to running out of his medicines).  Chronic combined systolic and diastolic heart failure  -- ECHO 11/18/14 EF  30-35%, G3DD, and mod Pulm HTN (PA pk pressure Hg). Felt to be 2/2 uncontrolled HTN -- Continue Coreg and hydralazine + Imdur. Lisinopril discontinued due to renal dysfunction.  --  Discharge weight 190 lbs. Today 192 lbs with clothes. He appears euvolemic -- Continue Lasix  BID and KDur 20 MEq qd. Will repeat BMET today -- Will obtain 2D ECHO in 3 months to re-evaluate LV function now that he is back of all of his medications. -- We went over daily weights and salt/fluid restrictions.   Essential hypertension with acute hypertensive urgency: Secondary to medication noncompliance and volume overload. With diuresis and restarting medications, patient much improved.  -- BP 132/88 today. Continue coreg 12.5mg  BID, imdur , hydralazine  BID and lasix  BID.  CKD- creatinine on day of discharge at 1.79. Will repeat BMET  Acute gout - given renal dysfunction, started on prednisone and allopurinol. Follow with PCP, Dr. Alwyn Ren    Current medicines are reviewed at length with the patient today.  The patient does not have concerns regarding medicines.  The following changes have been made:  no change  Labs/ tests ordered today include:  Orders Placed This Encounter  Procedures  . Basic Metabolic Panel (BMET)  . ECHOCARDIOGRAM COMPLETE      Disposition:   FU with Dr Eden Emms after 2D ECHO in 3 months.   Charlestine Massed  12/01/2014 11:22 AM    [facility] [address] [place], [place], [place]  [address] Phone: [phone]; Fax: [phone]

## 2014-12-01 ENCOUNTER — Encounter: Payer: Self-pay | Admitting: Physician Assistant

## 2014-12-01 ENCOUNTER — Ambulatory Visit (INDEPENDENT_AMBULATORY_CARE_PROVIDER_SITE_OTHER): Payer: Self-pay | Admitting: Physician Assistant

## 2014-12-01 VITALS — BP 132/88 | HR 93 | Ht 67.0 in | Wt 192.0 lb

## 2014-12-01 DIAGNOSIS — I5043 Acute on chronic combined systolic (congestive) and diastolic (congestive) heart failure: Secondary | ICD-10-CM

## 2014-12-01 DIAGNOSIS — Z79899 Other long term (current) drug therapy: Secondary | ICD-10-CM

## 2014-12-01 LAB — BASIC METABOLIC PANEL
BUN: 39 mg/dL — ABNORMAL HIGH (ref 7–25)
CO2: 26 mmol/L (ref 20–31)
Calcium: 9.1 mg/dL (ref 8.6–10.3)
Chloride: 99 mmol/L (ref 98–110)
Creat: 2.01 mg/dL — ABNORMAL HIGH (ref 0.70–1.25)
GLUCOSE: 100 mg/dL — AB (ref 65–99)
POTASSIUM: 4.3 mmol/L (ref 3.5–5.3)
SODIUM: 137 mmol/L (ref 135–146)

## 2014-12-01 NOTE — Addendum Note (Signed)
Addended by: Tonita Phoenix on: 12/01/2014 11:41 AM   Modules accepted: Orders

## 2014-12-01 NOTE — Addendum Note (Signed)
Addended by: Tonita Phoenix on: 12/01/2014 11:40 AM   Modules accepted: Orders

## 2014-12-01 NOTE — Patient Instructions (Signed)
Your physician recommends that you schedule a follow-up appointment in: After Echo with Dr Eden Emms  Your physician recommends that you return for lab work in: Today [facility]  Your physician has requested that you have an echocardiogram in 3 Months. Echocardiography is a painless test that uses sound waves to create images of your heart. It provides your doctor with information about the size and shape of your heart and how well your heart's chambers and valves are working. This procedure takes approximately one hour. There are no restrictions for this procedure.  If you need a refill on your cardiac medications before your next appointment, please call your pharmacy.

## 2014-12-06 ENCOUNTER — Telehealth: Payer: Self-pay | Admitting: *Deleted

## 2014-12-06 NOTE — Telephone Encounter (Signed)
Called pt and made him aware of his lab results.  His f/u appts in 03-2015 was also verified for the pt.  Pt verbalized understanding

## 2014-12-06 NOTE — Telephone Encounter (Signed)
-----   Message from Janetta Hora, PA-C sent at 12/02/2014 12:25 PM EDT ----- Creat close to baseline and potassium looks good. No change in plans. Please let patient know.

## 2014-12-19 ENCOUNTER — Inpatient Hospital Stay: Payer: Self-pay | Admitting: Family Medicine

## 2014-12-28 ENCOUNTER — Inpatient Hospital Stay: Payer: Self-pay | Admitting: Family Medicine

## 2015-01-16 ENCOUNTER — Ambulatory Visit: Payer: Self-pay | Attending: Family Medicine | Admitting: Family Medicine

## 2015-01-16 ENCOUNTER — Encounter: Payer: Self-pay | Admitting: Family Medicine

## 2015-01-16 VITALS — BP 160/108 | HR 66 | Temp 98.0°F | Resp 16 | Ht 69.0 in | Wt 211.0 lb

## 2015-01-16 DIAGNOSIS — M1A9XX0 Chronic gout, unspecified, without tophus (tophi): Secondary | ICD-10-CM

## 2015-01-16 DIAGNOSIS — Z79899 Other long term (current) drug therapy: Secondary | ICD-10-CM | POA: Insufficient documentation

## 2015-01-16 DIAGNOSIS — E785 Hyperlipidemia, unspecified: Secondary | ICD-10-CM | POA: Insufficient documentation

## 2015-01-16 DIAGNOSIS — E663 Overweight: Secondary | ICD-10-CM

## 2015-01-16 DIAGNOSIS — I129 Hypertensive chronic kidney disease with stage 1 through stage 4 chronic kidney disease, or unspecified chronic kidney disease: Secondary | ICD-10-CM | POA: Insufficient documentation

## 2015-01-16 DIAGNOSIS — E669 Obesity, unspecified: Secondary | ICD-10-CM | POA: Insufficient documentation

## 2015-01-16 DIAGNOSIS — I10 Essential (primary) hypertension: Secondary | ICD-10-CM | POA: Insufficient documentation

## 2015-01-16 DIAGNOSIS — I429 Cardiomyopathy, unspecified: Secondary | ICD-10-CM | POA: Insufficient documentation

## 2015-01-16 DIAGNOSIS — I5022 Chronic systolic (congestive) heart failure: Secondary | ICD-10-CM | POA: Insufficient documentation

## 2015-01-16 DIAGNOSIS — M109 Gout, unspecified: Secondary | ICD-10-CM | POA: Insufficient documentation

## 2015-01-16 DIAGNOSIS — Z6831 Body mass index (BMI) 31.0-31.9, adult: Secondary | ICD-10-CM | POA: Insufficient documentation

## 2015-01-16 DIAGNOSIS — N183 Chronic kidney disease, stage 3 unspecified: Secondary | ICD-10-CM

## 2015-01-16 DIAGNOSIS — I5043 Acute on chronic combined systolic (congestive) and diastolic (congestive) heart failure: Secondary | ICD-10-CM | POA: Insufficient documentation

## 2015-01-16 MED ORDER — HYDRALAZINE HCL 25 MG PO TABS: 25.0000 mg | ORAL_TABLET | Freq: Two times a day (BID) | ORAL | Status: DC

## 2015-01-16 MED ORDER — CARVEDILOL 12.5 MG PO TABS: 12.5000 mg | ORAL_TABLET | Freq: Two times a day (BID) | ORAL | Status: DC

## 2015-01-16 MED ORDER — ISOSORBIDE MONONITRATE ER 30 MG PO TB24: 30.0000 mg | ORAL_TABLET | Freq: Every day | ORAL | Status: DC

## 2015-01-16 MED ORDER — ASPIRIN EC 81 MG PO TBEC: 81.0000 mg | DELAYED_RELEASE_TABLET | Freq: Every day | ORAL | Status: AC

## 2015-01-16 MED ORDER — FUROSEMIDE 40 MG PO TABS: 40.0000 mg | ORAL_TABLET | Freq: Two times a day (BID) | ORAL | Status: DC

## 2015-01-16 MED ORDER — ALLOPURINOL 100 MG PO TABS: 200.0000 mg | ORAL_TABLET | Freq: Every day | ORAL | Status: DC

## 2015-01-16 NOTE — Progress Notes (Signed)
Patients here for hospital f/up for Hyperlipedemia, Accelerated HTN.  Patient denies pain today. Patient requesting refills.  Patient states he's taken his meds today.

## 2015-01-16 NOTE — Progress Notes (Signed)
Subjective:    Patient ID: Jeffery Hodges, male    DOB: Sep 26, 1955, 59 y.o.   MRN: [id number]  HPI  59 year old male with a history of hypertension, hyperlipidemia, gout, CK D stage III, chronic combined systolic and diastolic heart failure ( EF 30-35%) recently hospitalized for CHF exacerbation in the setting of uncontrolled hypertension due to running out of his medications after he had lost his job and his insurance as well.  He is followed by cardiology at [facility] ; his most recent visit on 12/01/2014  He has no complaints today. His blood pressure is elevated and he admits to being compliant with medications but endorses taking some tablets of naproxen because he thought his left knee was swollen even though he had no pain.Marland Kitchen He denies any recent gout flares Has not been exercising but has been adhering to low sodium diet.  Past Medical History  Diagnosis Date  . ED (erectile dysfunction)   . HTN (hypertension)   . Chronic systolic CHF (congestive heart failure) (HCC)     EF 30% in the past => improved to normal;  echo 6/13:  Mod LVH, EF 55%, Gr 1 DD, mild LAE, normal RV sized and fxn  . Chronic kidney disease   . Hyperlipidemia   . Cardiomyopathy (HCC)     likley HTN;  EF 30% => improved to normal (echo in 2012 and 2013 with normal LVF);  myoview 2004: inf defect likely diaph atten, no ischemia  . Acute on chronic combined systolic and diastolic heart failure (HCC) 02/05/2014  . 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 Uncontrolled hypertension 02/05/2014  . Acute gout due to renal impairment involving foot 11/20/2014  . Acute on chronic kidney failure (HCC) 11/17/2014  . CKD (chronic kidney disease) stage 3, GFR 30-59 ml/min 11/20/2014  . CRF (chronic renal failure) 10/10/2010  . Overweight (BMI 25.0-29.9) 11/20/2014  . SOB (shortness of breath) 02/05/2014    History reviewed. No pertinent past surgical history.  No Known Allergies  Current Outpatient Prescriptions on File Prior  to Visit  Medication Sig Dispense Refill  . potassium chloride 20 MEQ TBCR Take 20 mEq by mouth daily. 30 tablet 3  . pravastatin (PRAVACHOL) 20 MG tablet Take 1 tablet (20 mg total) by mouth daily. 30 tablet 3   No current facility-administered medications on file prior to visit.    .   Review of Systems  Constitutional: Negative for activity change and appetite change.  HENT: Negative for sinus pressure and sore throat.   Eyes: Negative for visual disturbance.  Respiratory: Negative for cough, chest tightness and shortness of breath.   Cardiovascular: Negative for chest pain and leg swelling.  Gastrointestinal: Negative for abdominal pain, diarrhea, constipation and abdominal distention.  Endocrine: Negative.   Genitourinary: Negative for dysuria.  Musculoskeletal: Negative for myalgias and joint swelling.  Skin: Negative for rash.  Allergic/Immunologic: Negative.   Neurological: Negative for weakness, light-headedness and numbness.  Psychiatric/Behavioral: Negative for suicidal ideas and dysphoric mood.       Objective: Filed Vitals:   01/16/15 1430 01/16/15 1446  BP: 160/115 160/108  Pulse: 66   Temp: 98 F (36.7 C)   TempSrc: Oral   Resp: 16   Height:  (1.753 m)   Weight: 211 lb (95.709 kg)   SpO2: 95%       Physical Exam  Constitutional: He is oriented to person, place, and time. He appears well-developed and well-nourished.  HENT:  Head: Normocephalic and atraumatic.  Right Ear: External ear normal.  Left Ear: External ear normal.  Eyes: Conjunctivae and EOM are normal. Pupils are equal, round, and reactive to light.  Neck: Normal range of motion. Neck supple. No tracheal deviation present.  Cardiovascular: Normal rate, regular rhythm and normal heart sounds.   No murmur heard. Pulmonary/Chest: Effort normal and breath sounds normal. No respiratory distress. He has no wheezes. He exhibits no tenderness.  Abdominal: Soft. Bowel sounds are normal. He  exhibits no mass. There is no tenderness.  Musculoskeletal: Normal range of motion. He exhibits no edema or tenderness.  Neurological: He is alert and oriented to person, place, and time.  Skin: Skin is warm and dry.  Psychiatric: He has a normal mood and affect.    CMP Latest Ref Rng 12/01/2014 11/20/2014 11/19/2014  Glucose 65 - 99 mg/dL 893(Y) 101(B) 510(C)  BUN 7 - 25 mg/dL 58(N) 27(P) 82(U)  Creatinine 0.70 - 1.25 mg/dL 2.35(T) 6.14(E) 3.15(Q)  Sodium 135 - 146 mmol/L 137 135 137  Potassium 3.5 - 5.3 mmol/L 4.3 3.4(L) 4.6  Chloride 98 - 110 mmol/L 99 96(L) 102  CO2 20 - 31 mmol/L 26 31 28   Calcium 8.6 - 10.3 mg/dL 9.1 0.0(Q) 8.9  Total Protein 6.0 - 8.3 g/dL - - -  Total Bilirubin 0.3 - 1.2 mg/dL - - -  Alkaline Phos 39 - 117 U/L - - -  AST 0 - 37 U/L - - -  ALT 0 - 53 U/L - - -     Lipid Panel     Component Value Date/Time   CHOL  01/20/2010 0512    172        ATP III CLASSIFICATION:  <200     mg/dL   Desirable  676-195  mg/dL   Borderline High  >=093    mg/dL   High          TRIG 92 01/20/2010 0512   HDL 36* 01/20/2010 0512   CHOLHDL 4.8 01/20/2010 0512   VLDL 18 01/20/2010 0512   LDLCALC * 01/20/2010 0512    118        Total Cholesterol/HDL:CHD Risk Coronary Heart Disease Risk Table                     Men   Women  1/2 Average Risk   3.4   3.3  Average Risk       5.0   4.4  2 X Average Risk   9.6   7.1  3 X Average Risk  23.4   11.0        Use the calculated Patient Ratio above and the CHD Risk Table to determine the patient's CHD Risk.        ATP III CLASSIFICATION (LDL):  <100     mg/dL   Optimal  267-124  mg/dL   Near or Above                    Optimal  130-159  mg/dL   Borderline  580-998  mg/dL   High  >338     mg/dL   Very High   LDLDIRECT 87 04/29/2013 1230    Wt Readings from Last 3 Encounters:  01/16/15 211 lb (95.709 kg)  12/01/14 192 lb (87.091 kg)  11/20/14 190 lb 4.8 oz (86.32 kg)        Assessment & Plan:  Hypertension:   blood pressure is elevated today at 160/115 compared to at his  Cardiologist visit 6 weeks ago- where it was 132/88.  current elevation could be due to the fact that he used some naproxen tablets today which I have advised him to abstain from. In the event that he has any joint pains or gout flare he will need tramadol for pain.  advised on low sodium diet, DASH diet  We'll reassess blood pressure at his next office visit  Congestive heart failure : EF 30-35%: He has gained 9 pounds in the last 6 weeks. Advised on daily weight checks, low-sodium diet and restricting fluids to less than 2 L per day.  CKD: Avoid Nephrotoxic agents Will monitor renal function especially since he is on Lasix  Gout: Stable No flares at this time.  Obesity: Advised on reducing portion sizes, exercising.

## 2015-02-22 MED FILL — POTASSIUM CL ER 20 MEQ TAB: 20 | 30 days supply | Qty: 30 | Fill #2

## 2015-02-22 MED FILL — PRAVASTATIN NA 20 MG TAB: 20 | 30 days supply | Qty: 30 | Fill #2

## 2015-02-22 MED FILL — ISOSORBIDE MN ER 30 MG TAB: 30 | 30 days supply | Qty: 30 | Fill #0

## 2015-02-22 MED FILL — CARVEDILOL 12.5 MG TABLET: 12.5 | 30 days supply | Qty: 60 | Fill #1

## 2015-02-22 MED FILL — hydrALAZINE HCL 25 MG TABS: 25 | 30 days supply | Qty: 60 | Fill #0

## 2015-02-22 MED FILL — ?FUROSEMIDE 40 MG TABLET: 40 | 30 days supply | Qty: 60 | Fill #0

## 2015-02-22 MED FILL — ?ALLOPURINOL 100 MG TABLET: 100 | 30 days supply | Qty: 60 | Fill #0

## 2015-03-01 ENCOUNTER — Other Ambulatory Visit (HOSPITAL_COMMUNITY): Payer: Self-pay

## 2015-03-07 ENCOUNTER — Ambulatory Visit: Payer: Self-pay | Admitting: Cardiovascular Disease

## 2015-03-23 ENCOUNTER — Other Ambulatory Visit: Payer: Self-pay | Admitting: Family Medicine

## 2015-03-23 MED FILL — CARVEDILOL 12.5 MG TABLET: 12.5 | 30 days supply | Qty: 60 | Fill #2

## 2015-03-23 MED FILL — FUROSEMIDE 40 MG TABLET: 40 | 30 days supply | Qty: 60 | Fill #1

## 2015-03-23 MED FILL — hydrALAZINE HCL 25 MG TABS: 25 | 30 days supply | Qty: 60 | Fill #1

## 2015-03-23 MED FILL — ISOSORBIDE MN ER 30 MG TAB: 30 | 30 days supply | Qty: 30 | Fill #1

## 2015-04-09 NOTE — Progress Notes (Signed)
Patient ID: Jeffery Hodges, male   DOB: 10-Sep-1955, 60 y.o.   MRN: [id number]     Cardiology Office Note   Date:  04/11/2015   ID:  Jeffery Hodges, DOB 1955/09/01, MRN [id number]  PCP:  [name], MD  Cardiologist:  [name]    Post hospital follow up     History of Present Illness: Jeffery Hodges is a 60 y.o. male who presents for HTN, HLD, chronic systolic/diastolic CHF, and CKD who was  admitted to [facility] 11/17/14  for acute on chronic systolic CHF in the setting of uncontrolled HTN (due to running out of his medicines).  Per notes he previously had an EF of 30% that improved to normal in 2013. His last 2D ECHO in 01/2014 showed EF 40-45% and G1DD. Myoview in 2004 with no ischemia. The patient lost his job/insurance in 09/2014 and could not afford his medications and stopped taking his meds.   He was admitted for congestive heart failure exacerbation on 11/17/14 after noting several days of increasing dyspnea on exertion. Initially patient was complaining of some mild chest pressure but with diuretics and nitroglycerin, he felt better. He responded well to diuretics. Repeat echocardiogram noted significant decrease in ejection fraction ( 30-35%), G3DD, and mod Pulm HTN (PA pk pressure 41mg Hg). By day of discharge, patient had diuresed several liters. Discharge weight 190 lbs. Dr NAcie Fredricksonsaw in consult and felt that his newly worsened EF was due to uncontrolled HTN and that his systolic function would likely improve with resumption of medications. The patient's lisinopril was discontinued due to renal dysfunction and hydralazine 280mBID + imdur 3027mas added. He was discharged on lasix 27m24mD as well as coreg and pravistatin. His hospital course was further c/b a gout flare and prednisone and allopurinol were added. He was discharged on 11/20/14.   BP up today only taking bid pills once in am  Went over daily med plan with him today  Reviewed echo from today preliminary EF 45-50%     Past  Medical History  Diagnosis Date  . ED (erectile dysfunction)   . HTN (hypertension)   . Chronic systolic CHF (congestive heart failure) (HCC)     EF 30% in the past => improved to normal;  echo 6/13:  Mod LVH, EF 55%, Gr 1 DD, mild LAE, normal RV sized and fxn  . Chronic kidney disease   . Hyperlipidemia   . Cardiomyopathy (HCC)[place]  likley HTN;  EF 30% => improved to normal (echo in 2012 and 2013 with normal LVF);  myoview 2004: inf defect likely diaph atten, no ischemia  . Acute on chronic combined systolic and diastolic heart failure (HCC)[place]9/2016  . 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 Uncontrolled hypertension 02/05/2014  . Acute gout due to renal impairment involving foot 11/20/2014  . Acute on chronic kidney failure (HCC)[place]/20/2016  . CKD (chronic kidney disease) stage 3, GFR 30-59 ml/min 11/20/2014  . CRF (chronic renal failure) 10/10/2010  . Overweight (BMI 25.0-29.9) 11/20/2014  . SOB (shortness of breath) 02/05/2014    Past Surgical History  Procedure Laterality Date  . No past surgeries       Current Outpatient Prescriptions  Medication Sig Dispense Refill  . allopurinol (ZYLOPRIM) 100 MG tablet TAKE 2 TABLETS BY MOUTH DAILY. 60 tablet 2  . aspirin EC 81 MG tablet Take 1 tablet (81 mg total) by mouth daily. 30 tablet 3  . carvedilol (COREG) 12.5 MG tablet Take 1 tablet (12.5 mg  total) by mouth 2 (two) times daily with a meal. 180 tablet 3  . furosemide (LASIX) 40 MG tablet Take 1 tablet (40 mg total) by mouth 2 (two) times daily. 180 tablet 3  . hydrALAZINE (APRESOLINE) 25 MG tablet Take 1 tablet (25 mg total) by mouth 2 (two) times daily. 180 tablet 3  . isosorbide mononitrate (IMDUR) 30 MG 24 hr tablet Take 1 tablet (30 mg total) by mouth daily. 90 tablet 3  . Potassium Chloride ER 20 MEQ TBCR Take 20 mEq by mouth daily. 90 tablet 3  . pravastatin (PRAVACHOL) 20 MG tablet Take 1 tablet (20 mg total) by mouth daily. 90 tablet 3   No current facility-administered  medications for this visit.    Allergies:   Review of patient's allergies indicates no known allergies.    Social History:  The patient  reports that he has quit smoking. He does not have any smokeless tobacco history on file. He reports that he drinks about 1.8 oz of alcohol per week. He reports that he does not use illicit drugs.   Family History: Mother died of lupus and father died of prostate cancer. He hs three sisters and one died of unknown causes. He does not know if they have any other medical problems   ROS:  Please see the history of present illness.   Otherwise, review of systems are positive for none.   All other systems are reviewed and negative.    PHYSICAL EXAM: VS:  BP 160/100 mmHg  Pulse 73  Ht '5\' 9"'$  (1.753 m)  Wt 99.066 kg (218 lb 6.4 oz)  BMI 32.24 kg/m2  SpO2 91% , BMI Body mass index is 32.24 kg/(m^2). GEN: Well nourished, well developed, in no acute distress HEENT: normal Neck: no JVD, carotid bruits, or masses Cardiac: RRR; no murmurs, rubs, or gallops,no edema  Respiratory:  clear to auscultation bilaterally, normal work of breathing GI: soft, nontender, nondistended, + BS MS: no deformity or atrophy Skin: warm and dry, no rash Neuro:  Strength and sensation are intact Psych: euthymic mood, full affect   EKG:   11/18/14  SR rate 87 PVC LAFB  Nonspecific ST changes     Recent Labs: 11/17/2014: TSH 1.307 11/19/2014: Hemoglobin 12.5*; Platelets 298 11/20/2014: B Natriuretic Peptide 875.1* 12/01/2014: BUN 39*; Creat 2.01*; Potassium 4.3; Sodium 137       Wt Readings from Last 3 Encounters:  04/11/15 99.066 kg (218 lb 6.4 oz)  01/16/15 95.709 kg (211 lb)  12/01/14 87.091 kg (192 lb)      Other studies Reviewed: Additional studies/ records that were reviewed today include: 2D ECHO, myoview  Review of the above records demonstrates: ECHO 11/18/14 EF 30-35%, G3DD, and mod Pulm HTN (PA pk pressure 77mg Hg).  Myoview 2004- no  ischemia.   ASSESSMENT AND PLAN:  TCap Massiis a 60y.o. male who presents for HTN, HLD, chronic systolic/diastolic CHF, and CKD [facility] the setting of uncontrolled HTN (due to running out of his medicines).  Chronic combined systolic and diastolic heart failure  -- ECHO 11/18/14 EF 30-35%, G3DD, and mod Pulm HTN (PA pk pressure 560m Hg).  EF better on echo today Felt to be 2/2 uncontrolled HTN -- Continue Coreg and hydralazine + Imdur. Lisinopril discontinued due to renal dysfunction.  --  He appears euvolemic -- Continue Lasix '40mg'$  BID and KDur 20 MEq qd. Will repeat labs Friday as he ate  this am  -- We went over daily weights and salt/fluid restrictions.   Essential hypertension with acute hypertensive urgency: Secondary to medication noncompliance and volume overload. With diuresis and restarting medications, patient much improved.  -- BP elevated Continue coreg 12.27m BID, imdur 348m hydralazine 2562mID and lasix 79m5mD.  CKD- creatinine on day of discharge at 1.79. Will repeat BMET Friday   Acute gout - given renal dysfunction, started on prednisone and allopurinol. Follow with PCP, Dr. HoppLinna DarnerCurrent medicines are reviewed at length with the patient today.  The patient does not have concerns regarding medicines.  The following changes have been made:  no change  Labs/ tests ordered today include:  BMET , PSA, A1c BNP  Orders Placed This Encounter  Procedures  . Comp Met (CMET)  . CBC with Differential/Platelet  . Lipid panel  . PSA  . Hemoglobin A1c     Disposition:   FU with me in 6 months   Signed, PeteJenkins Rouge  04/11/2015 12:18 PM    [facility] [address]  [phone]: [phone]: [phone]

## 2015-04-10 ENCOUNTER — Encounter: Payer: Self-pay | Admitting: *Deleted

## 2015-04-11 ENCOUNTER — Other Ambulatory Visit: Payer: Self-pay | Admitting: Physician Assistant

## 2015-04-11 ENCOUNTER — Ambulatory Visit (HOSPITAL_COMMUNITY): Payer: Self-pay | Attending: Internal Medicine

## 2015-04-11 ENCOUNTER — Ambulatory Visit (INDEPENDENT_AMBULATORY_CARE_PROVIDER_SITE_OTHER): Payer: Self-pay | Admitting: Cardiovascular Disease

## 2015-04-11 ENCOUNTER — Other Ambulatory Visit: Payer: Self-pay

## 2015-04-11 ENCOUNTER — Encounter: Payer: Self-pay | Admitting: Cardiovascular Disease

## 2015-04-11 VITALS — BP 160/100 | HR 73 | Ht 69.0 in | Wt 218.4 lb

## 2015-04-11 DIAGNOSIS — I5043 Acute on chronic combined systolic (congestive) and diastolic (congestive) heart failure: Secondary | ICD-10-CM

## 2015-04-11 DIAGNOSIS — I10 Essential (primary) hypertension: Secondary | ICD-10-CM

## 2015-04-11 DIAGNOSIS — I351 Nonrheumatic aortic (valve) insufficiency: Secondary | ICD-10-CM | POA: Insufficient documentation

## 2015-04-11 DIAGNOSIS — I517 Cardiomegaly: Secondary | ICD-10-CM | POA: Insufficient documentation

## 2015-04-11 DIAGNOSIS — I059 Rheumatic mitral valve disease, unspecified: Secondary | ICD-10-CM | POA: Insufficient documentation

## 2015-04-11 DIAGNOSIS — Z79899 Other long term (current) drug therapy: Secondary | ICD-10-CM | POA: Insufficient documentation

## 2015-04-11 MED ORDER — HYDRALAZINE HCL 25 MG PO TABS: 25.0000 mg | ORAL_TABLET | Freq: Two times a day (BID) | ORAL | Status: DC

## 2015-04-11 MED ORDER — CARVEDILOL 12.5 MG PO TABS: 12.5000 mg | ORAL_TABLET | Freq: Two times a day (BID) | ORAL | Status: DC

## 2015-04-11 MED ORDER — PRAVASTATIN SODIUM 20 MG PO TABS
20.0000 mg | ORAL_TABLET | Freq: Every day | ORAL | Status: DC
Start: 2015-04-11 — End: 2015-04-11

## 2015-04-11 MED ORDER — ISOSORBIDE MONONITRATE ER 30 MG PO TB24: 30.0000 mg | ORAL_TABLET | Freq: Every day | ORAL | Status: DC

## 2015-04-11 MED ORDER — POTASSIUM CHLORIDE ER 20 MEQ PO TBCR: 20.0000 meq | EXTENDED_RELEASE_TABLET | Freq: Every day | ORAL | Status: DC

## 2015-04-11 MED ORDER — FUROSEMIDE 40 MG PO TABS: 40.0000 mg | ORAL_TABLET | Freq: Two times a day (BID) | ORAL | Status: DC

## 2015-04-11 MED ORDER — PRAVASTATIN SODIUM 20 MG PO TABS: 20.0000 mg | ORAL_TABLET | Freq: Every day | ORAL | Status: DC

## 2015-04-11 MED FILL — PRAVASTATIN NA 20 MG TAB: 20 | 30 days supply | Qty: 30 | Fill #0

## 2015-04-11 MED FILL — POTASSIUM CL ER 20 MEQ TAB: 20 | 30 days supply | Qty: 30 | Fill #0

## 2015-04-11 MED FILL — hydrALAZINE HCL 25 MG TABS: 25 | 30 days supply | Qty: 60 | Fill #0

## 2015-04-11 MED FILL — FUROSEMIDE 40 MG TABLET: 40 | 30 days supply | Qty: 60 | Fill #0

## 2015-04-11 MED FILL — ISOSORBIDE MN ER 30 MG TAB: 30 | 30 days supply | Qty: 30 | Fill #0

## 2015-04-11 MED FILL — ?CARVEDILOL 12.5 MG TABLET: 12.5 | 30 days supply | Qty: 60 | Fill #0

## 2015-04-11 NOTE — Patient Instructions (Addendum)
Medication Instructions:  Your physician recommends that you continue on your current medications as directed. Please refer to the Current Medication list given to you today.  Labwork: Your physician recommends that you return for lab work on Friday for CMET, CBC, Lipid, PSA, HgbA1c  Testing/Procedures: NONE  Follow-Up: Your physician wants you to follow-up in: 6 months with Dr. Eden Emms. You will receive a reminder letter in the mail two months in advance. If you don't receive a letter, please call our office to schedule the follow-up appointment.  If you need a refill on your cardiac medications before your next appointment, please call your pharmacy.

## 2015-04-14 ENCOUNTER — Other Ambulatory Visit: Payer: Self-pay

## 2015-04-17 ENCOUNTER — Other Ambulatory Visit: Payer: Self-pay | Admitting: Physician Assistant

## 2015-04-17 DIAGNOSIS — I5043 Acute on chronic combined systolic (congestive) and diastolic (congestive) heart failure: Secondary | ICD-10-CM

## 2015-04-17 DIAGNOSIS — Z79899 Other long term (current) drug therapy: Secondary | ICD-10-CM

## 2015-04-17 LAB — ECHOCARDIOGRAM LIMITED
A4CEF: 40 %
AORTIC ROOT 2D: 37 mm
Ao-asc: 39 mm
CHL CUP DOP CALC LVOT VTI: 13.7 cm
CHL CUP LA SIZE INDEX: 1.78 mm/m2
CHL CUP LA VOL 2D INDEX: 15.5 mL/m2
CHL CUP LA VOL 2D: 34 mL
CHL CUP LVOT MEAN VEL: 55.1 cm/s
CHL CUP SV INDEX: 24.7 mL/m2
E decel time: 349 msec
EERAT: 12.31
ESTIMATED CVP: 3 mm HG
FS: 20 % — AB (ref 28–44)
IVS/LV PW RATIO, ED: 1.13
LA diam end sys: 39 mm
LASIZE: 39 mm
LDCA: 2.84 cm2
LV PW d: 12.4 mm — AB (ref 0.6–1.1)
LV SIMPSON'S DISK: 40
LV TDI E'MEDIAL: 4.58 cm/s
LV dias vol index: 62 mL/m2
LV dias vol: 135 mL (ref 62–150)
LV sys vol index: 37 mL/m2
LV sys vol: 135 mL — AB (ref 21–61)
LVIDD: 48.9 mm — AB (ref 3.5–6.0)
LVIDS: 39.1 mm — AB (ref 2.1–4.0)
LVOT diameter: 19 mm
LVOT peak grad rest: 2 mmHg
LVOTPV: 75.2 cm/s
MV Dec: 349 ms
MV pk A vel: 99.7 cm/s
MVPKEVEL: 52.8 cm/s
PWSYS: 12.4 mm
Stroke v: 54 ml
TDI e' lateral: 4.29 cm/s

## 2015-05-19 MED FILL — hydrALAZINE HCL 25 MG TABS: 25 | 30 days supply | Qty: 60 | Fill #1

## 2015-05-19 MED FILL — ISOSORBIDE MN ER 30 MG TAB: 30 | 30 days supply | Qty: 30 | Fill #1

## 2015-05-19 MED FILL — PRAVASTATIN NA 20 MG TAB: 20 | 30 days supply | Qty: 30 | Fill #1

## 2015-05-19 MED FILL — POTASSIUM CL ER 20 MEQ TAB: 20 | 30 days supply | Qty: 30 | Fill #1

## 2015-05-19 MED FILL — CARVEDILOL 12.5 MG TABLET: 12.5 | 30 days supply | Qty: 60 | Fill #1

## 2015-05-19 MED FILL — ?FUROSEMIDE 40 MG TABLET: 40 | 30 days supply | Qty: 60 | Fill #1

## 2015-06-27 MED FILL — ALLOPURINOL 100 MG TABLET: 100 | 30 days supply | Qty: 60 | Fill #0

## 2015-06-27 MED FILL — hydrALAZINE HCL 25 MG TABS: 25 | 30 days supply | Qty: 60 | Fill #2

## 2015-06-27 MED FILL — ?FUROSEMIDE 40 MG TABLET: 40 | 30 days supply | Qty: 60 | Fill #2

## 2015-06-27 MED FILL — POTASSIUM CL ER 20 MEQ TAB: 20 | 30 days supply | Qty: 30 | Fill #2

## 2015-06-27 MED FILL — PRAVASTATIN NA 20 MG TAB: 20 | 30 days supply | Qty: 30 | Fill #2

## 2015-06-27 MED FILL — CARVEDILOL 12.5 MG TABLET: 12.5 | 30 days supply | Qty: 60 | Fill #2

## 2015-06-27 MED FILL — ISOSORBIDE MN ER 30 MG TAB: 30 | 30 days supply | Qty: 30 | Fill #2

## 2015-08-18 MED FILL — ALLOPURINOL 100 MG TABLET: 100 | 30 days supply | Qty: 60 | Fill #1

## 2015-08-18 MED FILL — FUROSEMIDE 40 MG TABLET: 40 | 30 days supply | Qty: 60 | Fill #3

## 2015-08-18 MED FILL — POTASSIUM CL ER 20 MEQ TAB: 20 | 30 days supply | Qty: 30 | Fill #3

## 2015-08-18 MED FILL — PRAVASTATIN NA 20 MG TAB: 20 | 30 days supply | Qty: 30 | Fill #3

## 2015-08-18 MED FILL — hydrALAZINE HCL 25 MG TABS: 25 | 30 days supply | Qty: 60 | Fill #3

## 2015-08-18 MED FILL — CARVEDILOL 12.5 MG TABLET: 12.5 | 30 days supply | Qty: 60 | Fill #3

## 2015-08-18 MED FILL — ISOSORBIDE MN ER 30 MG TAB: 30 | 30 days supply | Qty: 30 | Fill #3

## 2015-10-04 MED FILL — ISOSORBIDE MN ER 30 MG TAB: 30 | 30 days supply | Qty: 30 | Fill #4

## 2015-10-04 MED FILL — PRAVASTATIN NA 20 MG TAB: 20 | 30 days supply | Qty: 30 | Fill #4

## 2015-10-04 MED FILL — ?FUROSEMIDE 40 MG TABLET: 40 | 30 days supply | Qty: 60 | Fill #2

## 2015-10-04 MED FILL — hydrALAZINE HCL 25 MG TABS: 25 | 30 days supply | Qty: 60 | Fill #4

## 2015-10-04 MED FILL — POTASSIUM CL ER 20 MEQ TAB: 20 | 30 days supply | Qty: 30 | Fill #4

## 2015-10-04 MED FILL — ALLOPURINOL 100 MG TABLET: 100 | 30 days supply | Qty: 60 | Fill #2

## 2015-10-04 MED FILL — CARVEDILOL 12.5 MG TABLET: 12.5 | 30 days supply | Qty: 60 | Fill #4

## 2015-11-13 MED FILL — ?ALLOPURINOL 100MG TABLET: 100 | 30 days supply | Qty: 60 | Fill #3

## 2015-11-13 MED FILL — PRAVASTATIN NA 20 MG TAB: 20 | 30 days supply | Qty: 30 | Fill #5

## 2015-11-13 MED FILL — CARVEDILOL 12.5 MG TABLET: 12.5 | 30 days supply | Qty: 60 | Fill #5

## 2015-11-13 MED FILL — POTASSIUM CL ER 20 MEQ TAB: 20 | 30 days supply | Qty: 30 | Fill #5

## 2015-11-13 MED FILL — ?ISOSORBIDE MN ER 30 MG TAB: 30 | 30 days supply | Qty: 30 | Fill #5

## 2015-11-13 MED FILL — FUROSEMIDE 40 MG TABLET: 40 | 30 days supply | Qty: 60 | Fill #3

## 2015-11-13 MED FILL — hydrALAZINE HCL 25 MG TABS: 25 | 30 days supply | Qty: 60 | Fill #5

## 2015-12-07 ENCOUNTER — Other Ambulatory Visit: Payer: Self-pay | Admitting: Family Medicine

## 2015-12-07 DIAGNOSIS — I5043 Acute on chronic combined systolic (congestive) and diastolic (congestive) heart failure: Secondary | ICD-10-CM

## 2015-12-07 MED FILL — ISOSORBIDE MN ER 30 MG TAB: 30 | 30 days supply | Qty: 30 | Fill #6

## 2015-12-07 MED FILL — hydrALAZINE HCL 25 MG TABS: 25 | 30 days supply | Qty: 60 | Fill #6

## 2015-12-07 MED FILL — ?CARVEDILOL 12.5 MG TABLET: 12.5 | 30 days supply | Qty: 60 | Fill #6

## 2015-12-07 MED FILL — POTASSIUM CL ER 20 MEQ TAB: 20 | 30 days supply | Qty: 30 | Fill #6

## 2015-12-07 MED FILL — PRAVASTATIN NA 20 MG TAB: 20 | 30 days supply | Qty: 30 | Fill #6

## 2015-12-12 ENCOUNTER — Other Ambulatory Visit: Payer: Self-pay | Admitting: Cardiovascular Disease

## 2015-12-12 ENCOUNTER — Other Ambulatory Visit: Payer: Self-pay | Admitting: Family Medicine

## 2015-12-12 DIAGNOSIS — I5043 Acute on chronic combined systolic (congestive) and diastolic (congestive) heart failure: Secondary | ICD-10-CM

## 2015-12-12 MED ORDER — FUROSEMIDE 40 MG PO TABS: 40.0000 mg | ORAL_TABLET | Freq: Two times a day (BID) | ORAL | 2 refills | Status: DC

## 2015-12-14 ENCOUNTER — Other Ambulatory Visit: Payer: Self-pay | Admitting: Family Medicine

## 2015-12-19 ENCOUNTER — Telehealth: Payer: Self-pay | Admitting: Family Medicine

## 2015-12-19 NOTE — Telephone Encounter (Signed)
Called and left pt a message informing him that he has a prescription ready for pick up. Informed pt that he has to pick them up by Monday 11/27 before 3pm.

## 2016-01-12 MED FILL — POTASSIUM CL ER 20 MEQ TAB: 20 | 30 days supply | Qty: 30 | Fill #7

## 2016-01-12 MED FILL — CARVEDILOL 12.5 MG TABLET: 12.5 | 30 days supply | Qty: 60 | Fill #7

## 2016-01-12 MED FILL — PRAVASTATIN NA 20 MG TAB: 20 | 30 days supply | Qty: 30 | Fill #7

## 2016-01-12 MED FILL — hydrALAZINE HCL 25 MG TABS: 25 | 30 days supply | Qty: 60 | Fill #7

## 2016-01-12 MED FILL — ?ISOSORBIDE MN ER 30 MG TAB: 30 | 30 days supply | Qty: 30 | Fill #7

## 2016-01-19 MED FILL — FUROSEMIDE 40 MG TABLET: 40 | 30 days supply | Qty: 60 | Fill #0

## 2016-02-20 MED FILL — POTASSIUM CL ER 20 MEQ TAB: 20 | 30 days supply | Qty: 30 | Fill #8

## 2016-02-20 MED FILL — ?CARVEDILOL 12.5 MG TABLET: 12.5 | 30 days supply | Qty: 60 | Fill #8

## 2016-02-20 MED FILL — ?FUROSEMIDE 40 MG TABLET: 40 | 30 days supply | Qty: 60 | Fill #1

## 2016-02-20 MED FILL — ?ISOSORBIDE MN ER 30 MG TAB: 30 | 30 days supply | Qty: 30 | Fill #8

## 2016-02-20 MED FILL — PRAVASTATIN NA 20 MG TAB: 20 | 30 days supply | Qty: 30 | Fill #8

## 2016-02-20 MED FILL — hydrALAZINE HCL 25 MG TABS: 25 | 30 days supply | Qty: 60 | Fill #8

## 2016-04-02 MED FILL — POTASSIUM CL ER 20 MEQ TAB: 20 | 30 days supply | Qty: 30 | Fill #9

## 2016-04-02 MED FILL — ?CARVEDILOL 12.5 MG TABLET: 12.5 | 30 days supply | Qty: 60 | Fill #9

## 2016-04-02 MED FILL — ISOSORBIDE MN ER 30 MG TAB: 30 | 30 days supply | Qty: 30 | Fill #9

## 2016-04-02 MED FILL — hydrALAZINE HCL 25 MG TABS: 25 | 30 days supply | Qty: 60 | Fill #9

## 2016-04-02 MED FILL — PRAVASTATIN NA 20 MG TAB: 20 | 30 days supply | Qty: 30 | Fill #9

## 2016-04-02 MED FILL — ?FUROSEMIDE 40 MG TABLET: 40 | 30 days supply | Qty: 60 | Fill #2

## 2016-04-05 ENCOUNTER — Other Ambulatory Visit: Payer: Self-pay | Admitting: Cardiovascular Disease

## 2016-04-05 DIAGNOSIS — I5043 Acute on chronic combined systolic (congestive) and diastolic (congestive) heart failure: Secondary | ICD-10-CM

## 2016-04-30 ENCOUNTER — Other Ambulatory Visit: Payer: Self-pay | Admitting: Cardiovascular Disease

## 2016-04-30 DIAGNOSIS — I10 Essential (primary) hypertension: Secondary | ICD-10-CM

## 2016-04-30 DIAGNOSIS — I5043 Acute on chronic combined systolic (congestive) and diastolic (congestive) heart failure: Secondary | ICD-10-CM

## 2016-04-30 MED FILL — FUROSEMIDE 40 MG TABLET: 40 | 30 days supply | Qty: 60 | Fill #0

## 2016-05-01 MED FILL — POTASSIUM CL ER 20 MEQ TAB: 20 | 30 days supply | Qty: 30 | Fill #0

## 2016-05-01 MED FILL — CARVEDILOL 12.5 MG TABLET: 12.5 | 30 days supply | Qty: 60 | Fill #0

## 2016-05-01 MED FILL — hydrALAZINE HCL 25 MG TABS: 25 | 30 days supply | Qty: 60 | Fill #0

## 2016-05-01 MED FILL — PRAVASTATIN NA 20 MG TAB: 20 | 30 days supply | Qty: 30 | Fill #0

## 2016-05-01 MED FILL — ?ISOSORBIDE MN ER 30 MG TAB: 30 | 30 days supply | Qty: 30 | Fill #0

## 2016-06-06 ENCOUNTER — Other Ambulatory Visit: Payer: Self-pay | Admitting: Cardiovascular Disease

## 2016-06-06 DIAGNOSIS — I5043 Acute on chronic combined systolic (congestive) and diastolic (congestive) heart failure: Secondary | ICD-10-CM

## 2016-08-07 MED FILL — POTASSIUM CL ER 20 MEQ TAB: 20 | 30 days supply | Qty: 30 | Fill #1

## 2016-08-07 MED FILL — ?FUROSEMIDE 40 MG TABLET: 40 | 30 days supply | Qty: 60 | Fill #0

## 2016-08-07 MED FILL — ?CARVEDILOL 12.5 MG TABLET: 12.5 | 30 days supply | Qty: 60 | Fill #1

## 2016-08-07 MED FILL — hydrALAZINE HCL 25 MG TABS: 25 | 30 days supply | Qty: 60 | Fill #1

## 2016-08-07 MED FILL — PRAVASTATIN NA 20 MG TAB: 20 | 30 days supply | Qty: 30 | Fill #1

## 2016-08-07 MED FILL — ?ISOSORBIDE MN ER 30 MG TAB: 30 | 30 days supply | Qty: 30 | Fill #1

## 2016-08-22 IMAGING — CR DG FOOT COMPLETE 3+V*R*
3 series · 3 of 3 positions shown · non-contrast
Comparison: None.

CLINICAL DATA: Severe chronic foot pain.  History of gout.

EXAM:
RIGHT FOOT COMPLETE - 3+ VIEW

[t foot ap right]
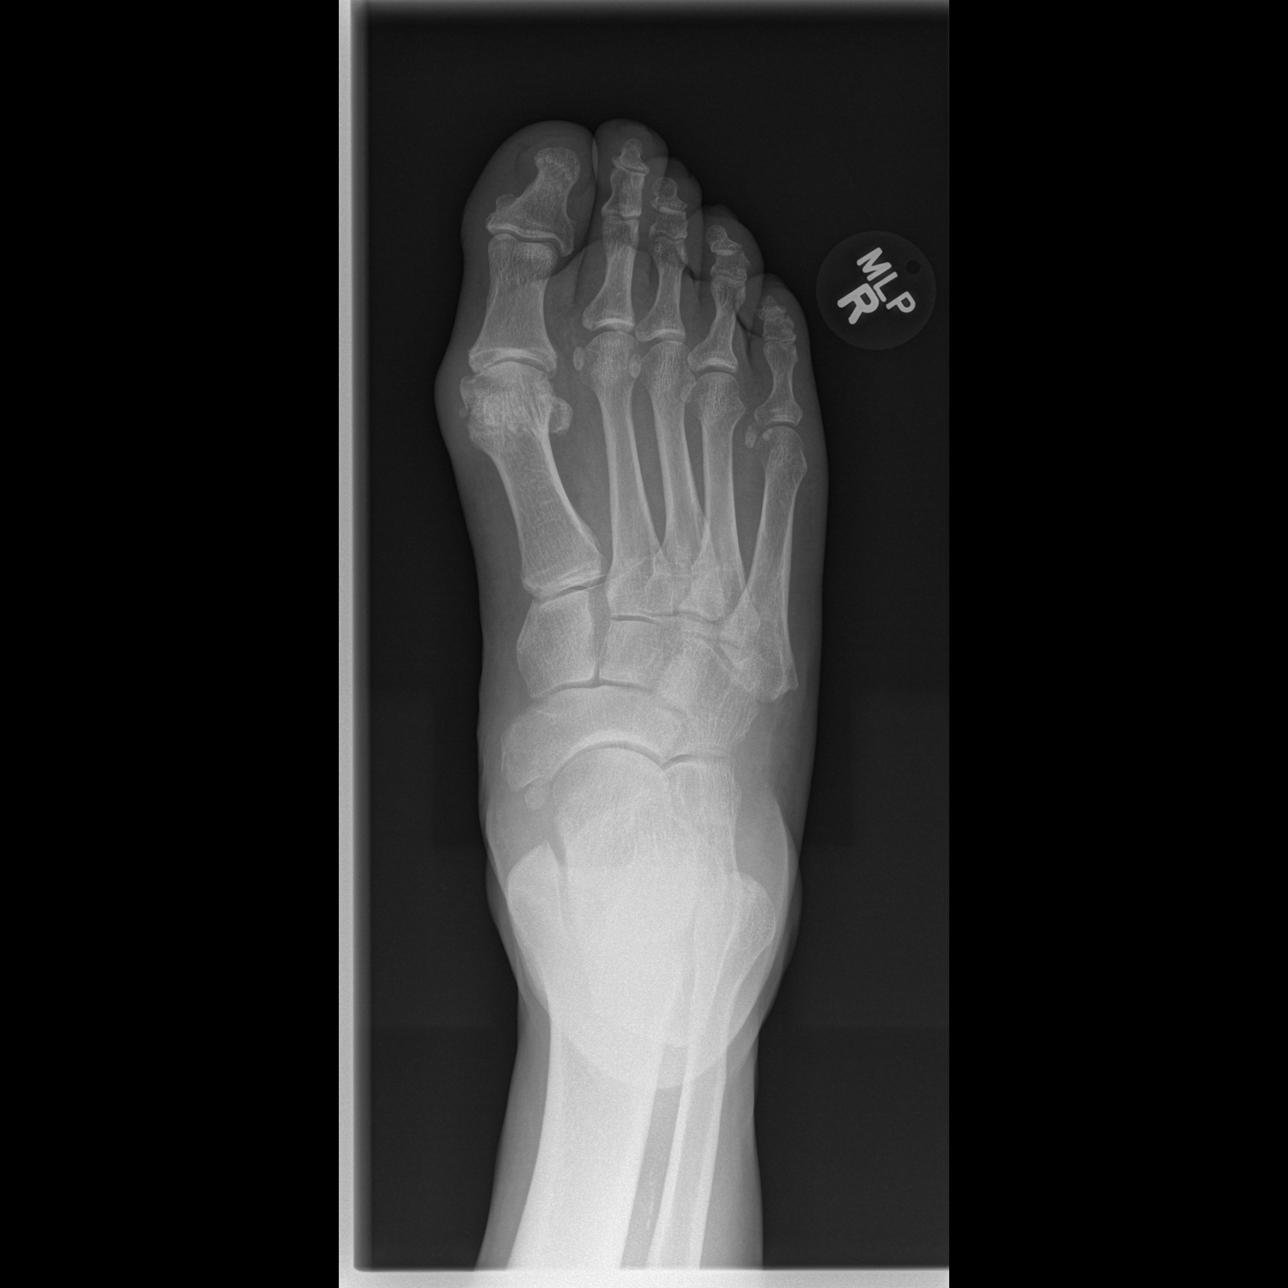

[t foot oblique right]
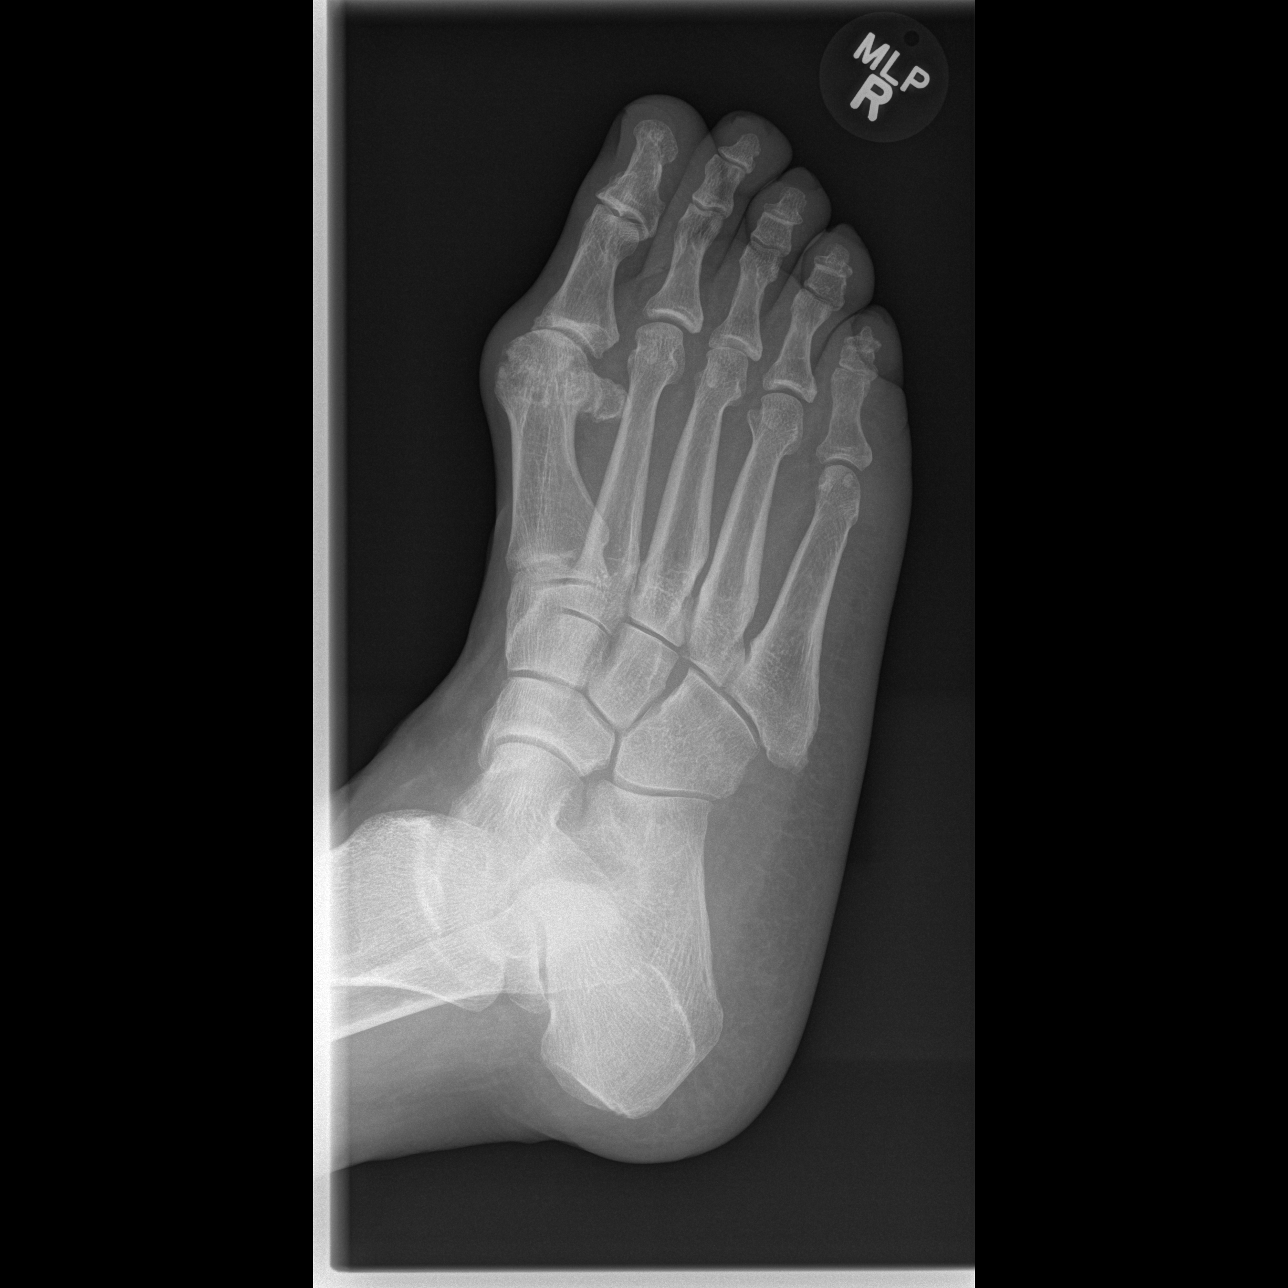

[t foot lat right]
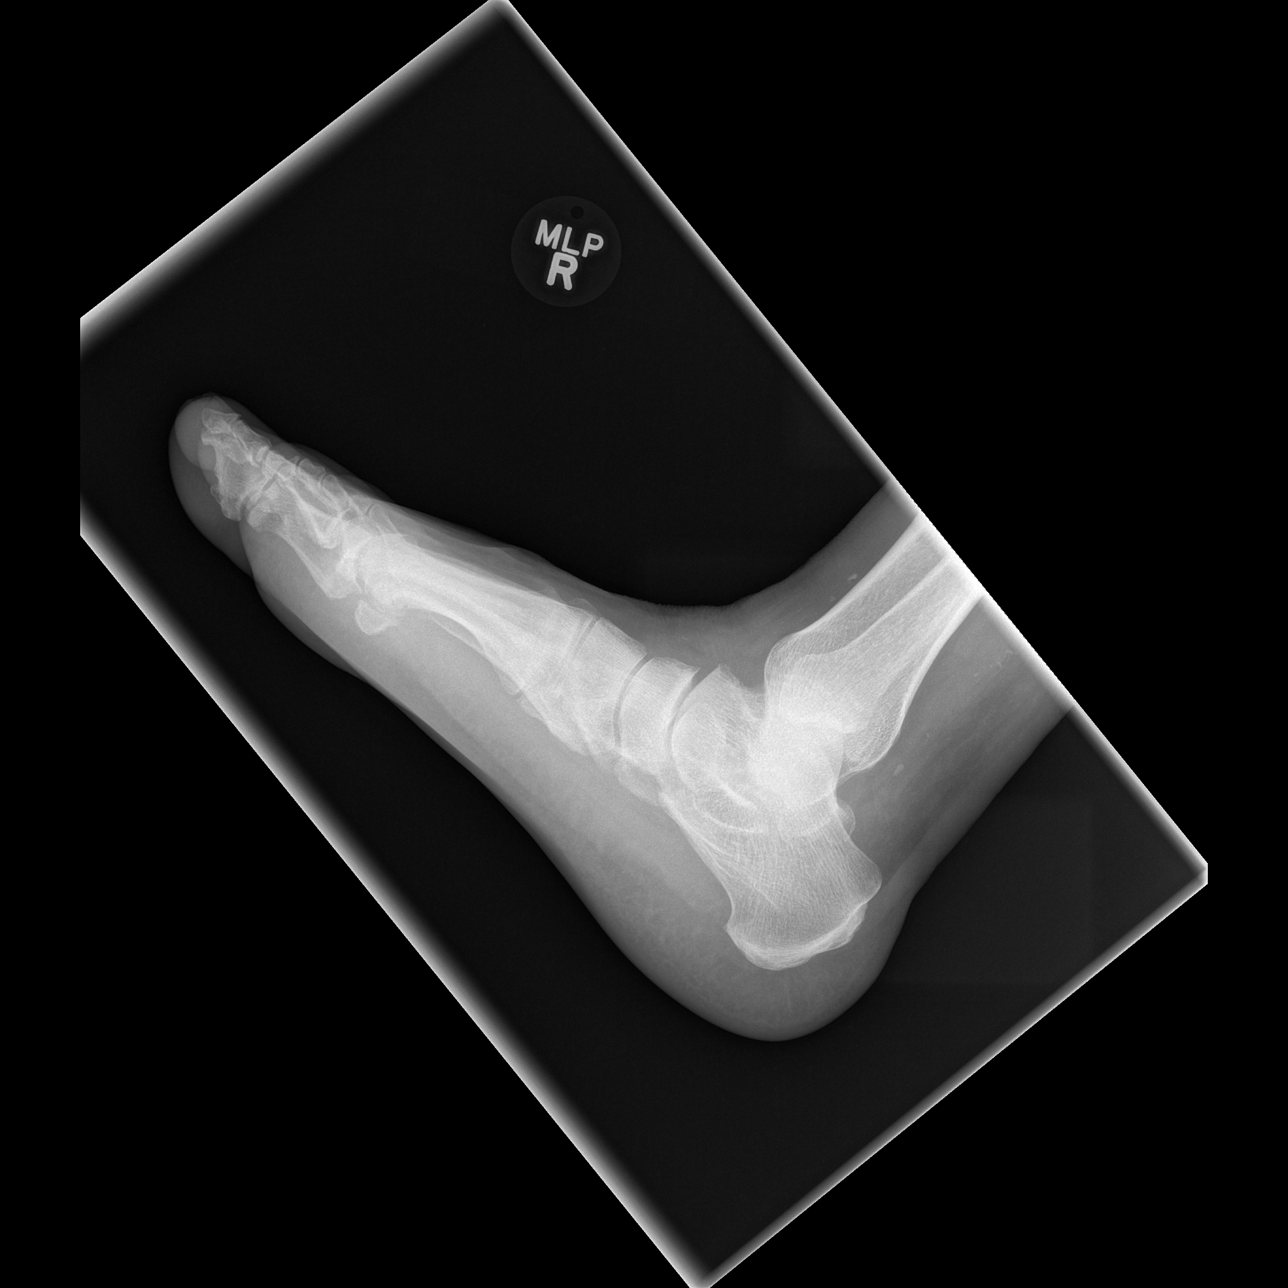

[3 of 3 positions shown; findings below may reference images not displayed]

FINDINGS: Moderate degenerative changes at the first metatarsal phalangeal
joint with mild hallux valgus deformity. There is joint space
narrowing and erosive changes consistent with gout. The other
metatarsal phalangeal joints are maintained. No significant
degenerative changes involving the midfoot or hindfoot. Mild pes
planus.
IMPRESSION: Findings consistent with gouty arthritis involving the first
metatarsal phalangeal joint.

## 2016-09-03 ENCOUNTER — Ambulatory Visit: Payer: Self-pay | Admitting: Family Medicine

## 2016-09-09 ENCOUNTER — Other Ambulatory Visit: Payer: Self-pay | Admitting: Cardiovascular Disease

## 2016-09-09 DIAGNOSIS — I5043 Acute on chronic combined systolic (congestive) and diastolic (congestive) heart failure: Secondary | ICD-10-CM

## 2016-09-23 ENCOUNTER — Encounter (HOSPITAL_COMMUNITY): Payer: Self-pay

## 2016-09-23 ENCOUNTER — Emergency Department (HOSPITAL_COMMUNITY)
Admission: EM | Admit: 2016-09-23 | Discharge: 2016-09-23 | Disposition: A | Discharge status: Home / Self Care | Payer: Self-pay | Attending: Emergency Medicine | Admitting: Emergency Medicine

## 2016-09-23 ENCOUNTER — Emergency Department (HOSPITAL_BASED_OUTPATIENT_CLINIC_OR_DEPARTMENT_OTHER)
Admit: 2016-09-23 | Discharge: 2016-09-23 | Disposition: A | Discharge status: Home / Self Care | Payer: Self-pay | Attending: Emergency Medicine | Admitting: Emergency Medicine

## 2016-09-23 DIAGNOSIS — I890 Lymphedema, not elsewhere classified: Secondary | ICD-10-CM | POA: Insufficient documentation

## 2016-09-23 DIAGNOSIS — I5043 Acute on chronic combined systolic (congestive) and diastolic (congestive) heart failure: Secondary | ICD-10-CM | POA: Insufficient documentation

## 2016-09-23 DIAGNOSIS — N183 Chronic kidney disease, stage 3 (moderate): Secondary | ICD-10-CM | POA: Insufficient documentation

## 2016-09-23 DIAGNOSIS — M7989 Other specified soft tissue disorders: Secondary | ICD-10-CM

## 2016-09-23 DIAGNOSIS — Z79899 Other long term (current) drug therapy: Secondary | ICD-10-CM | POA: Insufficient documentation

## 2016-09-23 DIAGNOSIS — I130 Hypertensive heart and chronic kidney disease with heart failure and stage 1 through stage 4 chronic kidney disease, or unspecified chronic kidney disease: Secondary | ICD-10-CM | POA: Insufficient documentation

## 2016-09-23 DIAGNOSIS — Z87891 Personal history of nicotine dependence: Secondary | ICD-10-CM | POA: Insufficient documentation

## 2016-09-23 LAB — CBC WITH DIFFERENTIAL/PLATELET
BASOS ABS: 0 10*3/uL (ref 0.0–0.1)
Basophils Relative: 0 %
EOS PCT: 5 %
Eosinophils Absolute: 0.2 10*3/uL (ref 0.0–0.7)
HCT: 31.7 % — ABNORMAL LOW (ref 39.0–52.0)
Hemoglobin: 10.9 g/dL — ABNORMAL LOW (ref 13.0–17.0)
LYMPHS PCT: 31 %
Lymphs Abs: 1.4 10*3/uL (ref 0.7–4.0)
MCH: 31.7 pg (ref 26.0–34.0)
MCHC: 34.4 g/dL (ref 30.0–36.0)
MCV: 92.2 fL (ref 78.0–100.0)
MONO ABS: 0.4 10*3/uL (ref 0.1–1.0)
MONOS PCT: 8 %
Neutro Abs: 2.6 10*3/uL (ref 1.7–7.7)
Neutrophils Relative %: 56 %
PLATELETS: 246 10*3/uL (ref 150–400)
RBC: 3.44 MIL/uL — ABNORMAL LOW (ref 4.22–5.81)
RDW: 13.4 % (ref 11.5–15.5)
WBC: 4.6 10*3/uL (ref 4.0–10.5)

## 2016-09-23 LAB — BASIC METABOLIC PANEL
Anion gap: 5 (ref 5–15)
BUN: 24 mg/dL — AB (ref 6–20)
CALCIUM: 8.9 mg/dL (ref 8.9–10.3)
CO2: 28 mmol/L (ref 22–32)
Chloride: 106 mmol/L (ref 101–111)
Creatinine, Ser: 2.11 mg/dL — ABNORMAL HIGH (ref 0.61–1.24)
GFR calc Af Amer: 37 mL/min — ABNORMAL LOW (ref >60)
GFR, EST NON AFRICAN AMERICAN: 32 mL/min — AB (ref >60)
GLUCOSE: 95 mg/dL (ref 65–99)
Potassium: 4.4 mmol/L (ref 3.5–5.1)
Sodium: 139 mmol/L (ref 135–145)

## 2016-09-23 MED ORDER — HYDRALAZINE HCL 25 MG PO TABS
25.0000 mg | ORAL_TABLET | Freq: Once | ORAL | Status: AC
  Administered 2016-09-23: 25 mg via ORAL
  Filled 2016-09-23: qty 1

## 2016-09-23 MED ORDER — CARVEDILOL 12.5 MG PO TABS
12.5000 mg | ORAL_TABLET | Freq: Once | ORAL | Status: AC
  Administered 2016-09-23: 12.5 mg via ORAL
  Filled 2016-09-23: qty 1

## 2016-09-23 NOTE — ED Notes (Signed)
Patient reports he has not taken his CARVEDILOL and HYDRALAZINE for the last two days but states his prescription is ready at the pharmacy. He decided to come to the emergency department for evaluation.

## 2016-09-23 NOTE — ED Notes (Signed)
Ultrasound at bedside

## 2016-09-23 NOTE — Progress Notes (Signed)
*  PRELIMINARY RESULTS* Vascular Ultrasound Right upper extremity venous duplex has been completed.  Preliminary findings: The visualized veins of the right upper extremity appear negative for deep and superficial vein thrombosis. Mild interstitial fluid noted anterior right hand.  Preliminary results given to Dr. Ethelda Chick @ 19:35  Chauncey Fischer 09/23/2016, 7:35 PM

## 2016-09-23 NOTE — ED Provider Notes (Addendum)
[facility]-EMERGENCY DEPT Provider Note   CSN: [id number] Arrival date & time: 09/23/16  1624     History   Chief Complaint Chief Complaint  Patient presents with  . Arm Swelling  . hand swelling    HPI Jeffery Hodges is a 61 y.o. male.comPlains of right forearm swelling and right hand swelling onset on week ago. Denies fever denies trauma denies injury. Nothing makes symptoms better or worse. Pain is improved without treatment over a few days ago. He states she's had similar symptoms in the past but resolved spontaneously without treatment. He is uncertain of the etiology. He admits to not taking his blood pressure medicines earlier today. He denies any chest pain denies shortness of breath. No other associated symptoms  HPI  Past Medical History:  Diagnosis Date  . Acute gout due to renal impairment involving foot 11/20/2014  . Acute on chronic combined systolic and diastolic heart failure (HCC) 02/05/2014  . Acute on chronic kidney failure (HCC) 11/17/2014  . Cardiomyopathy (HCC)    likley HTN;  EF 30% => improved to normal (echo in 2012 and 2013 with normal LVF);  myoview 2004: inf defect likely diaph atten, no ischemia  . Chronic kidney disease   . Chronic systolic CHF (congestive heart failure) (HCC)    EF 30% in the past => improved to normal;  echo 6/13:  Mod LVH, EF 55%, Gr 1 DD, mild LAE, normal RV sized and fxn  . CKD (chronic kidney disease) stage 3, GFR 30-59 ml/min 11/20/2014  . CRF (chronic renal failure) 10/10/2010  . ED (erectile dysfunction)   . HTN (hypertension)   . Hyperlipidemia   . 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 Overweight (BMI 25.0-29.9) 11/20/2014  . SOB (shortness of breath) 02/05/2014  . Uncontrolled hypertension 02/05/2014    Patient Active Problem List   Diagnosis Date Noted  . ED (erectile dysfunction) 11/30/2014  . CKD (chronic kidney disease) stage 3, GFR 30-59 ml/min 11/20/2014  . Overweight (BMI 25.0-29.9) 11/20/2014  . Gout 11/20/2014  . Acute on  chronic kidney failure (HCC) 11/17/2014  . Uncontrolled hypertension 02/05/2014  . 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 Acute on chronic combined systolic and diastolic heart failure (HCC) 02/05/2014  . SOB (shortness of breath) 02/05/2014  . HTN (hypertension) 10/10/2010  . CRF (chronic renal failure) 10/10/2010    Past Surgical History:  Procedure Laterality Date  . NO PAST SURGERIES         Home Medications    Prior to Admission medications   Medication Sig Start Date End Date Taking? Authorizing Provider  allopurinol (ZYLOPRIM) 100 MG tablet TAKE 2 TABLETS BY MOUTH DAILY. Patient taking differently: TAKE 1 TABLET BY MOUTH DAILY. 03/24/15  Yes Jaclyn Shaggy, MD  carvedilol (COREG) 12.5 MG tablet TAKE 1 TABLET BY MOUTH 2 TIMES DAILY WITH A MEAL. 05/01/16  Yes Wendall Stade, MD  furosemide (LASIX) 40 MG tablet TAKE 1 TABLET BY MOUTH 2 TIMES DAILY. *PLEASE CALL AND SCHEDULE A ONE YEAR FOLLOW UP APPOINTMENT* 09/09/16  Yes Wendall Stade, MD  hydrALAZINE (APRESOLINE) 25 MG tablet TAKE 1 TABLET BY MOUTH 2 TIMES DAILY. 05/01/16  Yes Wendall Stade, MD  isosorbide mononitrate (IMDUR) 30 MG 24 hr tablet TAKE 1 TABLET BY MOUTH DAILY. 05/01/16  Yes Wendall Stade, MD  Potassium Chloride ER 20 MEQ TBCR TAKE 1 TABLET BY MOUTH ONCE DAILY 05/01/16  Yes Wendall Stade, MD  pravastatin (PRAVACHOL) 20 MG tablet TAKE 1 TABLET BY MOUTH DAILY. 05/01/16  Yes Wendall Stade, MD  aspirin EC 81 MG tablet Take 1 tablet (81 mg total) by mouth daily. Patient not taking: Reported on 09/23/2016 01/16/15   Jaclyn Shaggy, MD    Family History Family History  Problem Relation Age of Onset  . Sudden death Sister     Social History Social History  Substance Use Topics  . Smoking status: Former Games developer  . Smokeless tobacco: Never Used     Comment: PATIENT QUITE ABOUT A YEAR NOW  . Alcohol use 1.8 oz/week    3 Cans of beer per week     Comment: daily     Allergies   Patient has no known  allergies.   Review of Systems Review of Systems  Constitutional: Negative.   HENT: Negative.   Respiratory: Negative.   Cardiovascular: Negative.   Gastrointestinal: Negative.   Musculoskeletal: Positive for myalgias.       Right arm swelling and pain  Skin: Negative.   Neurological: Negative.   Psychiatric/Behavioral: Negative.   All other systems reviewed and are negative.    Physical Exam Updated Vital Signs BP (!) 168/123   Pulse 87   Temp 98.3 F (36.8 C) (Oral)   Resp 14   Ht 5\' 10"  (1.778 m)   Wt 90.7 kg (200 lb)   SpO2 99%   BMI 28.70 kg/m   Physical Exam  Constitutional: He appears well-developed and well-nourished.  HENT:  Head: Normocephalic and atraumatic.  Eyes: Pupils are equal, round, and reactive to light. Conjunctivae are normal.  Neck: Neck supple. No tracheal deviation present. No thyromegaly present.  Cardiovascular: Normal rate and regular rhythm.   No murmur heard. Pulmonary/Chest: Effort normal and breath sounds normal.  Abdominal: Soft. Bowel sounds are normal. He exhibits no distension. There is no tenderness.  Musculoskeletal: Normal range of motion. He exhibits edema. He exhibits no tenderness.  Right upper extremity moderately swollen at forearm and dorsum of hand with pitting edema. Radial pulse 2+. Not red warm, he is mildly diffusely tender along the forearm. Radial pulse 2+. Good capillary refill. All other extremities without redness swelling or tenderness neurovascularly intact  Neurological: He is alert. Coordination normal.  Skin: Skin is warm and dry. No rash noted.  Psychiatric: He has a normal mood and affect.  Nursing note and vitals reviewed.    ED Treatments / Results  Labs (all labs ordered are listed, but only abnormal results are displayed) Labs Reviewed  BASIC METABOLIC PANEL  CBC WITH DIFFERENTIAL/PLATELET    EKG  EKG Interpretation None      Results for orders placed or performed during the hospital  encounter of 09/23/16  Basic metabolic panel  Result Value Ref Range   Sodium 139 135 - 145 mmol/L   Potassium 4.4 3.5 - 5.1 mmol/L   Chloride 106 101 - 111 mmol/L   CO2 28 22 - 32 mmol/L   Glucose, Bld 95 65 - 99 mg/dL   BUN 24 (H) 6 - 20 mg/dL   Creatinine, Ser 5.36 (H) 0.61 - 1.24 mg/dL   Calcium 8.9 8.9 - 64.4 mg/dL   GFR calc non Af Amer 32 (L) >60 mL/min   GFR calc Af Amer 37 (L) >60 mL/min   Anion gap 5 5 - 15  CBC with Differential/Platelet  Result Value Ref Range   WBC 4.6 4.0 - 10.5 K/uL   RBC 3.44 (L) 4.22 - 5.81 MIL/uL   Hemoglobin 10.9 (L) 13.0 - 17.0 g/dL   HCT 03.4 (  L) 39.0 - 52.0 %   MCV 92.2 78.0 - 100.0 fL   MCH 31.7 26.0 - 34.0 pg   MCHC 34.4 30.0 - 36.0 g/dL   RDW 38.3 81.8 - 40.3 %   Platelets 246 150 - 400 K/uL   Neutrophils Relative % 56 %   Neutro Abs 2.6 1.7 - 7.7 K/uL   Lymphocytes Relative 31 %   Lymphs Abs 1.4 0.7 - 4.0 K/uL   Monocytes Relative 8 %   Monocytes Absolute 0.4 0.1 - 1.0 K/uL   Eosinophils Relative 5 %   Eosinophils Absolute 0.2 0.0 - 0.7 K/uL   Basophils Relative 0 %   Basophils Absolute 0.0 0.0 - 0.1 K/uL   No results found.  Radiology No results found.  Procedures Procedures (including critical care time)  Medications Ordered in ED Medications - No data to display   Initial Impression / Assessment and Plan / ED Course  I have reviewed the triage vital signs and the nursing notes.  Pertinent labs & imaging results that were available during my care of the patient were reviewed by me and considered in my medical decision making (see chart for details). Further testing such as CT scan offered the patient which he declined.   Exam is consistent with lymphedema. Etiology unclear. He'll be referred to Dr. Mina Marble He also has appointment with [facility] for middle of next month. He is encouraged to elevate his arm above his heart. Blood pressure recheck within the next 3 weeks Renal  insufficiency is chronic Final Clinical Impressions(s) / ED Diagnoses  Diagnosis #1 lymphedema of right arm #2 elevated blood pressure Final diagnoses:  None   #3 chronic renal insufficiency New Prescriptions New Prescriptions   No medications on file    #4 normocytic anemia Doug Sou, MD 09/23/16 2007    Doug Sou, MD 09/23/16 2008

## 2016-09-23 NOTE — Discharge Instructions (Signed)
elevate your right arm above your heart as much as possible for the next 3 days. If not improving call Dr. Mina Marble to schedule an appointment. Keep your scheduled appointment with the [facility] and community [facility] next month. Take Tylenol as needed for pain. Your blood pressure should be rechecked within the next 3 weeks. Today's was elevated at 150/105

## 2016-09-23 NOTE — ED Notes (Signed)
Provided patient a turkey sandwich.  

## 2016-09-24 MED FILL — ?FUROSEMIDE 40 MG TABLET: 40 | 30 days supply | Qty: 60 | Fill #0

## 2016-09-24 MED FILL — PRAVASTATIN NA 20 MG TAB: 20 | 30 days supply | Qty: 30 | Fill #2

## 2016-09-24 MED FILL — ?CARVEDILOL 12.5 MG TABLET: 12.5 | 30 days supply | Qty: 60 | Fill #2

## 2016-09-24 MED FILL — hydrALAZINE HCL 25 MG TABS: 25 | 30 days supply | Qty: 60 | Fill #2

## 2016-09-24 MED FILL — ISOSORBIDE MN ER 30 MG TAB: 30 | 30 days supply | Qty: 30 | Fill #2

## 2016-09-24 MED FILL — POTASSIUM CL ER 20 MEQ TAB: 20 | 30 days supply | Qty: 30 | Fill #2

## 2016-10-10 ENCOUNTER — Ambulatory Visit: Payer: Self-pay | Admitting: Family Medicine

## 2016-10-31 ENCOUNTER — Other Ambulatory Visit: Payer: Self-pay | Admitting: Cardiovascular Disease

## 2016-10-31 DIAGNOSIS — I5043 Acute on chronic combined systolic (congestive) and diastolic (congestive) heart failure: Secondary | ICD-10-CM

## 2016-10-31 MED FILL — PRAVASTATIN NA 20 MG TAB: 20 | 30 days supply | Qty: 30 | Fill #3

## 2016-10-31 MED FILL — ?CARVEDILOL 12.5 MG TABLET: 12.5 | 30 days supply | Qty: 60 | Fill #3

## 2016-10-31 MED FILL — hydrALAZINE HCL 25 MG TABS: 25 | 30 days supply | Qty: 60 | Fill #3

## 2016-10-31 MED FILL — POTASSIUM CL ER 20 MEQ TAB: 20 | 30 days supply | Qty: 30 | Fill #3

## 2016-10-31 MED FILL — ISOSORBIDE MN ER 30 MG TAB: 30 | 30 days supply | Qty: 30 | Fill #3

## 2016-11-08 ENCOUNTER — Telehealth: Payer: Self-pay | Admitting: Cardiovascular Disease

## 2016-11-08 ENCOUNTER — Other Ambulatory Visit: Payer: Self-pay | Admitting: Cardiovascular Disease

## 2016-11-08 ENCOUNTER — Other Ambulatory Visit: Payer: Self-pay | Admitting: *Deleted

## 2016-11-08 DIAGNOSIS — I5043 Acute on chronic combined systolic (congestive) and diastolic (congestive) heart failure: Secondary | ICD-10-CM

## 2016-11-08 MED ORDER — FUROSEMIDE 40 MG PO TABS
40.0000 mg | ORAL_TABLET | Freq: Two times a day (BID) | ORAL | 0 refills | Status: DC
Start: 2016-11-08 — End: 2017-03-03

## 2016-11-08 NOTE — Telephone Encounter (Signed)
Medication Detail    Disp Refills Start End   furosemide (LASIX) 40 MG tablet 30 tablet 0 11/01/2016    Sig: Please call our office and schedule appointment for further refills. [phone]. Thank you, 2nd attempt.   Sent to pharmacy as: furosemide (LASIX) 40 MG tablet   E-Prescribing Status: Receipt confirmed by pharmacy (11/01/2016 1:18 PM EDT)   Associated Diagnoses   Acute on chronic combined systolic and diastolic heart failure [facility])     Pharmacy   [facility] - [place], [place] - [address]

## 2016-11-08 NOTE — Telephone Encounter (Signed)
Follow up     Pt is calling asking if they can be called about his prescription.

## 2016-11-08 NOTE — Telephone Encounter (Signed)
I have just spoke with patient and made him aware that he is overdue for an appointment and that he needs to schedule an appointment before his medication can be refilled. He is aware that we can only refill the medication up until the appointment and if he does not keep the appointment further refill will need to be authorized by his pcp. He was willing to schedule an appointment. I transferred him to scheduling and he made an appointment for 02/03/17.

## 2016-11-08 NOTE — Telephone Encounter (Signed)
°  New Prob  Has a question regarding Lasix prescription. Request was sent for a refill, however, request was denied stating provider had already sent new script. Rep states they do not have any recent scripts. Last script received for Lasix was on September 09, 2016.

## 2016-11-20 ENCOUNTER — Ambulatory Visit: Payer: Self-pay | Admitting: Family Medicine

## 2016-12-16 MED FILL — ?FUROSEMIDE 40 MG TABLET: 40 | 30 days supply | Qty: 60 | Fill #0

## 2016-12-16 MED FILL — PRAVASTATIN NA 20 MG TAB: 20 | 30 days supply | Qty: 30 | Fill #4

## 2016-12-16 MED FILL — hydrALAZINE HCL 25 MG TABS: 25 | 30 days supply | Qty: 60 | Fill #4

## 2016-12-16 MED FILL — ISOSORBIDE MN ER 30 MG TAB: 30 | 30 days supply | Qty: 30 | Fill #4

## 2016-12-16 MED FILL — ?CARVEDILOL 12.5 MG TABLET: 12.5 | 30 days supply | Qty: 60 | Fill #4

## 2016-12-16 MED FILL — POTASSIUM CL ER 20 MEQ TAB: 20 | 30 days supply | Qty: 30 | Fill #4

## 2017-01-24 NOTE — Progress Notes (Deleted)
Patient ID: Jeffery Hodges, male   DOB: 11/16/1955, 61 y.o.   MRN: [id number]     Cardiology Office Note   Date:  01/24/2017   ID:  Jeffery Hodges, DOB 11/16/1955, MRN [id number]  PCP:  Peyton NajjarHopper, David H, MD  Cardiologist:  Dr. Eden EmmsNishan    Post hospital follow up     History of Present Illness: Jeffery Murdocherry Cogar is a 61 y.o. male who presents for f/u of HTN, HLD, chronic systolic/diastolic CHF, and CKD Ad[facility] to [facility]17/14  for acute on chronic systolic CHF in the setting of uncontrolled HTN (due to running out of his medicines).Has not been seen since March 2017 and called for med refill   Per notes he previously had an EF of 30% that improved to normal in 2013. His last 2D ECHO in 01/2014 showed EF 40-45% and G1DD. Myoview in 2004 with no ischemia.Sometimes has trouble affording meds and has been without insurance in past  11/17/14 admitted with med non compliance CHF and renal failure. Echo showed EF 30-35% ACE stopped and placed On hyralazine and nitrates. Had gout flair Rx with allopurinol and steroids  Last echo 04/11/15 EF 40-45% trivial AR   ***    Past Medical History:  Diagnosis Date  . Acute gout due to renal impairment involving foot 11/20/2014  . Acute on chronic combined systolic and diastolic heart failure (HCC) 02/05/2014  . Acute on chronic kidney failure (HCC) 11/17/2014  . Cardiomyopathy (HCC)    likley HTN;  EF 30% => improved to normal (echo in 2012 and 2013 with normal LVF);  myoview 2004: inf defect likely diaph atten, no ischemia  . Chronic kidney disease   . Chronic systolic CHF (congestive heart failure) (HCC)    EF 30% in the past => improved to normal;  echo 6/13:  Mod LVH, EF 55%, Gr 1 DD, mild LAE, normal RV sized and fxn  . CKD (chronic kidney disease) stage 3, GFR 30-59 ml/min 11/20/2014  . CRF (chronic renal failure) 10/10/2010  . ED (erectile dysfunction)   . HTN (hypertension)   . Hyperlipidemia   . 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 Overweight (BMI  25.0-29.9) 11/20/2014  . SOB (shortness of breath) 02/05/2014  . Uncontrolled hypertension 02/05/2014    Past Surgical History:  Procedure Laterality Date  . NO PAST SURGERIES       Current Outpatient Medications  Medication Sig Dispense Refill  . allopurinol (ZYLOPRIM) 100 MG tablet TAKE 2 TABLETS BY MOUTH DAILY. (Patient taking differently: TAKE 1 TABLET BY MOUTH DAILY.) 60 tablet 2  . aspirin EC 81 MG tablet Take 1 tablet (81 mg total) by mouth daily. (Patient not taking: Reported on 09/23/2016) 30 tablet 3  . carvedilol (COREG) 12.5 MG tablet TAKE 1 TABLET BY MOUTH 2 TIMES DAILY WITH A MEAL. 180 tablet 3  . furosemide (LASIX) 40 MG tablet Take 1 tablet (40 mg total) by mouth 2 (two) times daily. Pt must keep 02/03/17 appt for further refills or obtain from pcp.Patient aware. 180 tablet 0  . hydrALAZINE (APRESOLINE) 25 MG tablet TAKE 1 TABLET BY MOUTH 2 TIMES DAILY. 180 tablet 3  . isosorbide mononitrate (IMDUR) 30 MG 24 hr tablet TAKE 1 TABLET BY MOUTH DAILY. 90 tablet 3  . Potassium Chloride ER 20 MEQ TBCR TAKE 1 TABLET BY MOUTH ONCE DAILY 90 tablet 3  . pravastatin (PRAVACHOL) 20 MG tablet TAKE 1 TABLET BY MOUTH DAILY. 90 tablet 3   No current facility-administered medications for this visit.  Allergies:   Patient has no known allergies.    Social History:  The patient  reports that he has quit smoking. he has never used smokeless tobacco. He reports that he drinks about 1.8 oz of alcohol per week. He reports that he does not use drugs.   Family History: Mother died of lupus and father died of prostate cancer. He hs three sisters and one died of unknown causes. He does not know if they have any other medical problems   ROS:  Please see the history of present illness.   Otherwise, review of systems are positive for none.   All other systems are reviewed and negative.    PHYSICAL EXAM: VS:  There were no vitals taken for this visit. , BMI There is no height or weight on file to  calculate BMI. Affect appropriate Healthy:  appears stated age HEENT: normal Neck supple with no adenopathy JVP normal no bruits no thyromegaly Lungs clear with no wheezing and good diaphragmatic motion Heart:  S1/S2 no murmur, no rub, gallop or click PMI normal Abdomen: benighn, BS positve, no tenderness, no AAA no bruit.  No HSM or HJR Distal pulses intact with no bruits No edema Neuro non-focal Skin warm and dry No muscular weakness    EKG:   11/18/14  SR rate 87 PVC LAFB  Nonspecific ST changes     Recent Labs: 09/23/2016: BUN 24; Creatinine, Ser 2.11; Hemoglobin 10.9; Platelets 246; Potassium 4.4; Sodium 139       Wt Readings from Last 3 Encounters:  09/23/16 200 lb (90.7 kg)  04/11/15 218 lb 6.4 oz (99.1 kg)  01/16/15 211 lb (95.7 kg)      Other studies Reviewed: Additional studies/ records that were reviewed today include: 2D ECHO, myoview  Review of the above records demonstrates: ECHO 11/18/14 EF 30-35%, G3DD, and mod Pulm HTN (PA pk pressure Hg).  Myoview 2004- no ischemia.Echo 03/2015 EF 40-45%    ASSESSMENT AND PLAN:  [name] is a 61 y.o. male who presents for HTN, HLD, chronic systolic/diastolic CHF, and CKD who was  admitted to [facility] for acute on chronic systolic CHF in the setting of uncontrolled HTN (due to running out of his medicines).  Chronic combined systolic and diastolic heart failure  -- ECHO EF 40-45% March 2017  ACE stopped due to Cr 2.1 continue hydralazine nitrates coreg and lasix   Essential hypertension with acute hypertensive urgency: compliance still and issue continue current meds   CKD- CR 2.1 09/23/16 f/u nephrology   Acute gout - improved continue allopurinol    Current medicines are reviewed at length with the patient today.  The patient does not have concerns regarding medicines.  The following changes have been made:  no change  Labs/ tests ordered today include:    No orders of the defined types were placed in  this encounter.    Disposition:   FU with me in 6 months   Signed, Charlton Haws, MD  01/24/2017 1:48 PM    [facility] [address] [place], [place], [place]  [address] Phone: [phone]; Fax: [phone]

## 2017-02-03 ENCOUNTER — Ambulatory Visit: Payer: Self-pay | Admitting: Cardiovascular Disease

## 2017-02-04 MED FILL — POTASSIUM CL ER 20 MEQ TAB: 20 | 30 days supply | Qty: 30 | Fill #5

## 2017-02-04 MED FILL — PRAVASTATIN NA 20 MG TAB: 20 | 30 days supply | Qty: 30 | Fill #5

## 2017-02-04 MED FILL — ?HYDRALAZINE 25MG TAB: 25 | 30 days supply | Qty: 60 | Fill #5

## 2017-02-04 MED FILL — FUROSEMIDE 40 MG TAB: 40 | 30 days supply | Qty: 60 | Fill #1

## 2017-02-04 MED FILL — ?ISOSORBIDE MN 30 MG TAB SA: 30 | 30 days supply | Qty: 30 | Fill #5

## 2017-02-04 MED FILL — ?CARVEDILOL 12.5 MG TABLET: 12.5 | 30 days supply | Qty: 60 | Fill #5

## 2017-02-11 ENCOUNTER — Encounter: Payer: Self-pay | Admitting: Cardiovascular Disease

## 2017-03-03 ENCOUNTER — Encounter: Payer: Self-pay | Admitting: Family Medicine

## 2017-03-03 ENCOUNTER — Other Ambulatory Visit: Payer: Self-pay | Admitting: Family Medicine

## 2017-03-03 ENCOUNTER — Ambulatory Visit: Payer: Self-pay | Attending: Family Medicine | Admitting: Family Medicine

## 2017-03-03 VITALS — BP 170/112 | HR 67 | Temp 97.9°F | Wt 199.0 lb

## 2017-03-03 DIAGNOSIS — Z7982 Long term (current) use of aspirin: Secondary | ICD-10-CM | POA: Insufficient documentation

## 2017-03-03 DIAGNOSIS — I10 Essential (primary) hypertension: Secondary | ICD-10-CM

## 2017-03-03 DIAGNOSIS — I5042 Chronic combined systolic (congestive) and diastolic (congestive) heart failure: Secondary | ICD-10-CM | POA: Insufficient documentation

## 2017-03-03 DIAGNOSIS — N183 Chronic kidney disease, stage 3 unspecified: Secondary | ICD-10-CM

## 2017-03-03 DIAGNOSIS — Z79899 Other long term (current) drug therapy: Secondary | ICD-10-CM | POA: Insufficient documentation

## 2017-03-03 DIAGNOSIS — I130 Hypertensive heart and chronic kidney disease with heart failure and stage 1 through stage 4 chronic kidney disease, or unspecified chronic kidney disease: Secondary | ICD-10-CM | POA: Insufficient documentation

## 2017-03-03 DIAGNOSIS — I429 Cardiomyopathy, unspecified: Secondary | ICD-10-CM | POA: Insufficient documentation

## 2017-03-03 DIAGNOSIS — E785 Hyperlipidemia, unspecified: Secondary | ICD-10-CM | POA: Insufficient documentation

## 2017-03-03 DIAGNOSIS — M1A9XX0 Chronic gout, unspecified, without tophus (tophi): Secondary | ICD-10-CM | POA: Insufficient documentation

## 2017-03-03 DIAGNOSIS — E041 Nontoxic single thyroid nodule: Secondary | ICD-10-CM

## 2017-03-03 MED ORDER — ISOSORBIDE MONONITRATE ER 60 MG PO TB24: 60.0000 mg | ORAL_TABLET | Freq: Every day | ORAL | 3 refills | Status: AC

## 2017-03-03 MED ORDER — ALLOPURINOL 100 MG PO TABS: 100.0000 mg | ORAL_TABLET | Freq: Every day | ORAL | 3 refills | Status: AC

## 2017-03-03 MED ORDER — ATORVASTATIN CALCIUM 20 MG PO TABS: 20.0000 mg | ORAL_TABLET | Freq: Every day | ORAL | 3 refills | Status: AC

## 2017-03-03 MED ORDER — POTASSIUM CHLORIDE ER 20 MEQ PO TBCR: 1.0000 | EXTENDED_RELEASE_TABLET | Freq: Every day | ORAL | 3 refills | Status: AC

## 2017-03-03 MED ORDER — FUROSEMIDE 40 MG PO TABS: 40.0000 mg | ORAL_TABLET | Freq: Two times a day (BID) | ORAL | 3 refills | Status: AC

## 2017-03-03 MED ORDER — CARVEDILOL 12.5 MG PO TABS: ORAL_TABLET | ORAL | 3 refills | Status: AC

## 2017-03-03 MED ORDER — ISOSORBIDE MONONITRATE ER 30 MG PO TB24: 30.0000 mg | ORAL_TABLET | Freq: Every day | ORAL | 3 refills | Status: DC

## 2017-03-03 MED ORDER — HYDRALAZINE HCL 25 MG PO TABS: 25.0000 mg | ORAL_TABLET | Freq: Two times a day (BID) | ORAL | 3 refills | Status: AC

## 2017-03-03 MED FILL — ?FUROSEMIDE 40 MG TABLET: 40 | 30 days supply | Qty: 60 | Fill #0

## 2017-03-03 MED FILL — ?ALLOPURINOL 100 MG TABS: 100 | 30 days supply | Qty: 30 | Fill #0

## 2017-03-03 MED FILL — ?CARVEDILOL 12.5 MG TABLET: 12.5 | 30 days supply | Qty: 60 | Fill #0

## 2017-03-03 MED FILL — POTASSIUM CL ER 20 MEQ TAB: 20 | 30 days supply | Qty: 30 | Fill #0

## 2017-03-03 MED FILL — ?ATORVASTATIN 20MG TABL: 20 | 30 days supply | Qty: 30 | Fill #0

## 2017-03-03 MED FILL — ?ISOSORBIDE MN 30 MG TAB SA: 30 | 30 days supply | Qty: 30 | Fill #0

## 2017-03-03 NOTE — Patient Instructions (Signed)

## 2017-03-03 NOTE — Progress Notes (Signed)
Subjective:  Patient ID: Jeffery Hodges, male    DOB: [birth year]  Age: 62 y.o. MRN: [id number]  CC: Establish Care and Hypertension   HPI Jeffery Hodges is a 63 year old male with a history of chronic combined systolic and diastolic heart failure (EF 30-35%, diffuse hypokinesis from echo of 10/2014), stage III CKD, hypertension, hyperlipidemia who was last seen in the clinic 2 years ago and presents today to reestablish care.  He has not been under the care of any physician for a long while and was last seen by cardiology almost 2 years ago but he informs me he has been consistently taking his medications. He has noticed his blood pressure remains elevated despite his compliance but he denies compliance with a low-sodium diet. Denies shortness of breath, pedal edema, chest pains.  He has experienced intermittent gout flares in his fingers with associated swelling and pain for which he has used naproxen but denies intake of red meats.  He  was previously on allopurinol which he ran out of for a long time ago.  Past Medical History:  Diagnosis Date  . Acute gout due to renal impairment involving foot 11/20/2014  . Acute on chronic combined systolic and diastolic heart failure ([place]) 02/05/2014  . Acute on chronic kidney failure ([place]) 11/17/2014  . Cardiomyopathy ([place])    likley HTN;  EF 30% => improved to normal (echo in 2012 and 2013 with normal LVF);  myoview 2004: inf defect likely diaph atten, no ischemia  . Chronic kidney disease   . Chronic systolic CHF (congestive heart failure) (HCC)    EF 30% in the past => improved to normal;  echo 6/13:  Mod LVH, EF 55%, Gr 1 DD, mild LAE, normal RV sized and fxn  . CKD (chronic kidney disease) stage 3, GFR 30-59 ml/min (HCC) 11/20/2014  . CRF (chronic renal failure) 10/10/2010  . ED (erectile dysfunction)   . HTN (hypertension)   . Hyperlipidemia   . 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 Overweight (BMI 25.0-29.9) 11/20/2014  . SOB (shortness of  breath) 02/05/2014  . Uncontrolled hypertension 02/05/2014    Past Surgical History:  Procedure Laterality Date  . NO PAST SURGERIES      No Known Allergies   Outpatient Medications Prior to Visit  Medication Sig Dispense Refill  . aspirin EC 81 MG tablet Take 1 tablet (81 mg total) by mouth daily. 30 tablet 3  . allopurinol (ZYLOPRIM) 100 MG tablet TAKE 2 TABLETS BY MOUTH DAILY. (Patient taking differently: TAKE 1 TABLET BY MOUTH DAILY.) 60 tablet 2  . carvedilol (COREG) 12.5 MG tablet TAKE 1 TABLET BY MOUTH 2 TIMES DAILY WITH A MEAL. 180 tablet 3  . furosemide (LASIX) 40 MG tablet Take 1 tablet (40 mg total) by mouth 2 (two) times daily. Pt must keep 02/03/17 appt for further refills or obtain from pcp.Patient aware. 180 tablet 0  . hydrALAZINE (APRESOLINE) 25 MG tablet TAKE 1 TABLET BY MOUTH 2 TIMES DAILY. 180 tablet 3  . isosorbide mononitrate (IMDUR) 30 MG 24 hr tablet TAKE 1 TABLET BY MOUTH DAILY. 90 tablet 3  . Potassium Chloride ER 20 MEQ TBCR TAKE 1 TABLET BY MOUTH ONCE DAILY 90 tablet 3  . pravastatin (PRAVACHOL) 20 MG tablet TAKE 1 TABLET BY MOUTH DAILY. 90 tablet 3   No facility-administered medications prior to visit.     ROS Review of Systems  Constitutional: Negative for activity change and appetite change.  HENT: Negative for sinus pressure and sore throat.  Eyes: Negative for visual disturbance.  Respiratory: Negative for cough, chest tightness and shortness of breath.   Cardiovascular: Negative for chest pain and leg swelling.  Gastrointestinal: Negative for abdominal distention, abdominal pain, constipation and diarrhea.  Endocrine: Negative.   Genitourinary: Negative for dysuria.  Musculoskeletal:       See hpi  Skin: Negative for rash.  Allergic/Immunologic: Negative.   Neurological: Negative for weakness, light-headedness and numbness.  Psychiatric/Behavioral: Negative for dysphoric mood and suicidal ideas.    Objective:  BP (!) 170/112   Pulse 67    Temp 97.9 F (36.6 C) (Oral)   Wt 199 lb (90.3 kg)   SpO2 99%   BMI 28.55 kg/m   BP/Weight 03/03/2017 09/23/2016 9/32/6712  Systolic BP 458 099 833  Diastolic BP 825 053 976  Wt. (Lbs) 199 200 218.4  BMI 28.55 28.7 32.24      Physical Exam  Constitutional: He is oriented to person, place, and time. He appears well-developed and well-nourished.  Neck: No JVD present.  Cardiovascular: Normal rate, normal heart sounds and intact distal pulses.  No murmur heard. Pulmonary/Chest: Effort normal and breath sounds normal. He has no wheezes. He has no rales. He exhibits no tenderness.  Abdominal: Soft. Bowel sounds are normal. He exhibits no distension and no mass. There is no tenderness.  Musculoskeletal:  Flexion deformities of right fifth finger and left index finger with inability to make a complete fist bilaterally. No joint tenderness elicited at this time  Neurological: He is alert and oriented to person, place, and time.  Skin: Skin is warm and dry.  Psychiatric: He has a normal mood and affect.     Assessment & Plan:   1. Essential hypertension Uncontrolled Increase dose of isosorbide mononitrate from 30 mg to 60 mg daily Counseled on blood pressure goal of less than 130/80, low-sodium, DASH diet, medication compliance, 150 minutes of moderate intensity exercise per week. Discussed medication compliance, adverse effects. - CMP14+EGFR - carvedilol (COREG) 12.5 MG tablet; TAKE 1 TABLET BY MOUTH 2 TIMES DAILY WITH A MEAL.  Dispense: 60 tablet; Refill: 3  2. Chronic gout without tophus, unspecified cause, unspecified site He has had recent flares We will place on allopurinol for prophylaxis - allopurinol (ZYLOPRIM) 100 MG tablet; Take 1 tablet (100 mg total) by mouth daily.  Dispense: 30 tablet; Refill: 3  3. Chronic combined systolic and diastolic heart failure (HCC) EF 30-35%, diffuse hypokinesis from echocardiogram of 10/2014 Euvolemic at this time We will consider  repeat echocardiogram at next visit Switched from pravastatin to atorvastatin Daily weights, low-sodium, heart healthy diet - isosorbide mononitrate (IMDUR) 60 MG 24 hr tablet; Take 1 tablet (60 mg total) by mouth daily.  Dispense: 30 tablet; Refill: 3 - carvedilol (COREG) 12.5 MG tablet; TAKE 1 TABLET BY MOUTH 2 TIMES DAILY WITH A MEAL.  Dispense: 60 tablet; Refill: 3 - hydrALAZINE (APRESOLINE) 25 MG tablet; Take 1 tablet (25 mg total) by mouth 2 (two) times daily.  Dispense: 60 tablet; Refill: 3 - atorvastatin (LIPITOR) 20 MG tablet; Take 1 tablet (20 mg total) by mouth daily.  Dispense: 30 tablet; Refill: 3 - furosemide (LASIX) 40 MG tablet; Take 1 tablet (40 mg total) by mouth 2 (two) times daily.  Dispense: 60 tablet; Refill: 3 - Potassium Chloride ER 20 MEQ TBCR; Take 1 tablet by mouth daily.  Dispense: 30 tablet; Refill: 3  4. 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 allopurinol (ZYLOPRIM) 100 MG tablet    Sig: Take 1 tablet (100 mg total) by mouth daily.    Dispense:  30 tablet    Refill:  3  . isosorbide mononitrate (IMDUR) 30 MG 24 hr tablet    Sig: Take 1 tablet (30 mg total) by mouth daily.    Dispense:  30 tablet    Refill:  3  . carvedilol (COREG) 12.5 MG tablet    Sig: TAKE 1 TABLET BY MOUTH 2 TIMES DAILY WITH A MEAL.    Dispense:  60 tablet    Refill:  3  . hydrALAZINE (APRESOLINE) 25 MG tablet    Sig: Take 1 tablet (25 mg total) by mouth 2 (two) times daily.    Dispense:  60 tablet    Refill:  3  . atorvastatin (LIPITOR) 20 MG tablet    Sig: Take 1 tablet (20 mg total) by mouth daily.    Dispense:  30 tablet    Refill:  3  . furosemide (LASIX) 40 MG tablet    Sig: Take 1 tablet (40 mg total) by mouth 2 (two) times daily.    Dispense:  60 tablet    Refill:  3  . Potassium Chloride ER 20  MEQ TBCR    Sig: Take 1 tablet by mouth daily.    Dispense:  30 tablet    Refill:  3    Follow-up: Return in about 6 weeks (around 04/14/2017) for follow up of Hypertension.   Charlott Rakes MD

## 2017-03-04 ENCOUNTER — Other Ambulatory Visit: Payer: Self-pay | Admitting: Family Medicine

## 2017-03-04 LAB — CMP14+EGFR
ALT: 14 IU/L (ref 0–44)
AST: 16 IU/L (ref 0–40)
Albumin/Globulin Ratio: 1.2 (ref 1.2–2.2)
Albumin: 3.8 g/dL (ref 3.6–4.8)
Alkaline Phosphatase: 67 IU/L (ref 39–117)
BUN/Creatinine Ratio: 9 — ABNORMAL LOW (ref 10–24)
BUN: 20 mg/dL (ref 8–27)
Bilirubin Total: 0.3 mg/dL (ref 0.0–1.2)
CALCIUM: 9.6 mg/dL (ref 8.6–10.2)
CO2: 24 mmol/L (ref 20–29)
Chloride: 103 mmol/L (ref 96–106)
Creatinine, Ser: 2.17 mg/dL — ABNORMAL HIGH (ref 0.76–1.27)
GFR calc Af Amer: 36 mL/min/{1.73_m2} — ABNORMAL LOW (ref >59)
GFR, EST NON AFRICAN AMERICAN: 31 mL/min/{1.73_m2} — AB (ref >59)
GLUCOSE: 88 mg/dL (ref 65–99)
Globulin, Total: 3.3 g/dL (ref 1.5–4.5)
Potassium: 5.9 mmol/L — ABNORMAL HIGH (ref 3.5–5.2)
Sodium: 141 mmol/L (ref 134–144)
Total Protein: 7.1 g/dL (ref 6.0–8.5)

## 2017-03-04 MED ORDER — SODIUM POLYSTYRENE SULFONATE 15 GM/60ML PO SUSP: 30.0000 g | Freq: Once | ORAL | 0 refills | Status: AC

## 2017-03-04 MED ORDER — SODIUM POLYSTYRENE SULFONATE 15 GM/60ML PO SUSP: 30.0000 g | Freq: Once | ORAL | 0 refills | Status: DC

## 2017-03-05 ENCOUNTER — Telehealth: Payer: Self-pay | Admitting: *Deleted

## 2017-03-05 NOTE — Telephone Encounter (Signed)
Hoy Register, MD  Ronette Deter, CMA        Please inform to him to hold off on taking potassium as his potasium is severely elevated. I have sent a rx for Kayexalate to his pharmacy.Kidney function still shows a decline. Will recheck labs at next visit    Unable to reach patient. Left message on voicemail to return call.

## 2017-03-06 NOTE — Telephone Encounter (Signed)
Notes recorded by Hoy Register, MD on 03/04/2017 at 8:34 AM EST Please inform to him to hold off on taking potassium as his potasium is severely elevated. I have sent a rx for Kayexalate to his pharmacy.Kidney function still shows a decline. Will recheck labs at next visit  Pt aware of results and result note. Educated patient on s/s of hyperkalemia. Pt encourage to pick up medication KAYEXALATE at pharmacy.  Aware that multiple medication were sent to pharmacy but needed to pick up KAYEXALATE to reduce potassium.  Pt verbalized understanding and appreciated the call. After explanation, he continues to state he will pick up on tomorrow or Monday along with Hydralazine. Informed to d/c Potassium. He verbalized understanding.   [facility] Pharmacist notified to have patient take Kayexalate as well.

## 2017-04-09 MED FILL — FUROSEMIDE 40 MG TAB: 40 | 30 days supply | Qty: 60 | Fill #1

## 2017-04-09 MED FILL — CARVEDILOL 12.5 MG TABLET: 12.5 | 30 days supply | Qty: 60 | Fill #1

## 2017-04-09 MED FILL — hydrALAZINE HCL 25 MG TABS: 25 | 30 days supply | Qty: 60 | Fill #0

## 2017-04-09 MED FILL — ALLOPURINOL 100 MG TABLET: 100 | 30 days supply | Qty: 30 | Fill #1

## 2017-04-16 ENCOUNTER — Ambulatory Visit: Payer: Self-pay | Admitting: Family Medicine

## 2017-05-19 ENCOUNTER — Ambulatory Visit: Payer: Self-pay | Admitting: Internal Medicine

## 2017-05-19 DIAGNOSIS — Z2089 Contact with and (suspected) exposure to other communicable diseases: Secondary | ICD-10-CM

## 2017-05-23 ENCOUNTER — Ambulatory Visit: Payer: Self-pay | Admitting: Family Medicine

## 2017-06-09 MED FILL — CARVEDILOL 12.5 MG TABLET: 12.5 | 30 days supply | Qty: 60 | Fill #2

## 2017-06-09 MED FILL — ALLOPURINOL 100 MG TABLET: 100 | 30 days supply | Qty: 30 | Fill #2

## 2017-06-09 MED FILL — hydrALAZINE HCL 25 MG TABS: 25 | 30 days supply | Qty: 60 | Fill #1

## 2017-06-09 MED FILL — FUROSEMIDE 40 MG TAB: 40 | 30 days supply | Qty: 60 | Fill #2

## 2017-10-28 DEATH — deceased
